# Patient Record
Sex: Male | Born: 1935 | Race: White | Hispanic: No | Marital: Married | Smoking: Never smoker
Health system: Southern US, Community
[De-identification: ages and names within clinical notes are randomized; demographics above are authoritative.]

## PROBLEM LIST (undated history)

## (undated) DIAGNOSIS — F0390 Unspecified dementia without behavioral disturbance: Secondary | ICD-10-CM

## (undated) HISTORY — PX: CHOLECYSTECTOMY: SHX55

---

## 2018-12-20 ENCOUNTER — Inpatient Hospital Stay
Admission: EM | Admit: 2018-12-20 | Discharge: 2019-01-01 | DRG: 871 | Disposition: E | Payer: Medicare Other | Attending: Internal Medicine | Admitting: Internal Medicine

## 2018-12-20 ENCOUNTER — Emergency Department: Payer: Medicare Other

## 2018-12-20 ENCOUNTER — Encounter: Payer: Self-pay | Admitting: Emergency Medicine

## 2018-12-20 ENCOUNTER — Other Ambulatory Visit: Payer: Self-pay

## 2018-12-20 DIAGNOSIS — D696 Thrombocytopenia, unspecified: Secondary | ICD-10-CM | POA: Diagnosis present

## 2018-12-20 DIAGNOSIS — G9341 Metabolic encephalopathy: Secondary | ICD-10-CM | POA: Diagnosis present

## 2018-12-20 DIAGNOSIS — Z20828 Contact with and (suspected) exposure to other viral communicable diseases: Secondary | ICD-10-CM | POA: Diagnosis present

## 2018-12-20 DIAGNOSIS — R64 Cachexia: Secondary | ICD-10-CM | POA: Diagnosis present

## 2018-12-20 DIAGNOSIS — X58XXXA Exposure to other specified factors, initial encounter: Secondary | ICD-10-CM | POA: Diagnosis present

## 2018-12-20 DIAGNOSIS — Z9049 Acquired absence of other specified parts of digestive tract: Secondary | ICD-10-CM

## 2018-12-20 DIAGNOSIS — Z79891 Long term (current) use of opiate analgesic: Secondary | ICD-10-CM

## 2018-12-20 DIAGNOSIS — I5021 Acute systolic (congestive) heart failure: Secondary | ICD-10-CM | POA: Diagnosis not present

## 2018-12-20 DIAGNOSIS — R339 Retention of urine, unspecified: Secondary | ICD-10-CM | POA: Diagnosis not present

## 2018-12-20 DIAGNOSIS — Z23 Encounter for immunization: Secondary | ICD-10-CM | POA: Diagnosis present

## 2018-12-20 DIAGNOSIS — I440 Atrioventricular block, first degree: Secondary | ICD-10-CM | POA: Diagnosis present

## 2018-12-20 DIAGNOSIS — I4891 Unspecified atrial fibrillation: Secondary | ICD-10-CM | POA: Diagnosis not present

## 2018-12-20 DIAGNOSIS — J9600 Acute respiratory failure, unspecified whether with hypoxia or hypercapnia: Secondary | ICD-10-CM

## 2018-12-20 DIAGNOSIS — I361 Nonrheumatic tricuspid (valve) insufficiency: Secondary | ICD-10-CM | POA: Diagnosis not present

## 2018-12-20 DIAGNOSIS — S50312A Abrasion of left elbow, initial encounter: Secondary | ICD-10-CM | POA: Diagnosis present

## 2018-12-20 DIAGNOSIS — G309 Alzheimer's disease, unspecified: Secondary | ICD-10-CM | POA: Diagnosis present

## 2018-12-20 DIAGNOSIS — J9601 Acute respiratory failure with hypoxia: Secondary | ICD-10-CM | POA: Diagnosis not present

## 2018-12-20 DIAGNOSIS — K8050 Calculus of bile duct without cholangitis or cholecystitis without obstruction: Secondary | ICD-10-CM | POA: Diagnosis not present

## 2018-12-20 DIAGNOSIS — R4182 Altered mental status, unspecified: Secondary | ICD-10-CM | POA: Diagnosis present

## 2018-12-20 DIAGNOSIS — R509 Fever, unspecified: Secondary | ICD-10-CM | POA: Diagnosis not present

## 2018-12-20 DIAGNOSIS — Z515 Encounter for palliative care: Secondary | ICD-10-CM | POA: Diagnosis not present

## 2018-12-20 DIAGNOSIS — J690 Pneumonitis due to inhalation of food and vomit: Secondary | ICD-10-CM | POA: Diagnosis not present

## 2018-12-20 DIAGNOSIS — F05 Delirium due to known physiological condition: Secondary | ICD-10-CM | POA: Diagnosis not present

## 2018-12-20 DIAGNOSIS — E43 Unspecified severe protein-calorie malnutrition: Secondary | ICD-10-CM | POA: Diagnosis present

## 2018-12-20 DIAGNOSIS — K8051 Calculus of bile duct without cholangitis or cholecystitis with obstruction: Secondary | ICD-10-CM | POA: Diagnosis present

## 2018-12-20 DIAGNOSIS — R001 Bradycardia, unspecified: Secondary | ICD-10-CM | POA: Diagnosis not present

## 2018-12-20 DIAGNOSIS — I4581 Long QT syndrome: Secondary | ICD-10-CM | POA: Diagnosis not present

## 2018-12-20 DIAGNOSIS — R05 Cough: Secondary | ICD-10-CM | POA: Diagnosis not present

## 2018-12-20 DIAGNOSIS — N1330 Unspecified hydronephrosis: Secondary | ICD-10-CM | POA: Diagnosis present

## 2018-12-20 DIAGNOSIS — R059 Cough, unspecified: Secondary | ICD-10-CM

## 2018-12-20 DIAGNOSIS — Z7189 Other specified counseling: Secondary | ICD-10-CM | POA: Diagnosis not present

## 2018-12-20 DIAGNOSIS — Z79899 Other long term (current) drug therapy: Secondary | ICD-10-CM

## 2018-12-20 DIAGNOSIS — Z66 Do not resuscitate: Secondary | ICD-10-CM | POA: Diagnosis present

## 2018-12-20 DIAGNOSIS — K8033 Calculus of bile duct with acute cholangitis with obstruction: Secondary | ICD-10-CM | POA: Diagnosis not present

## 2018-12-20 DIAGNOSIS — R6521 Severe sepsis with septic shock: Secondary | ICD-10-CM | POA: Diagnosis not present

## 2018-12-20 DIAGNOSIS — J45909 Unspecified asthma, uncomplicated: Secondary | ICD-10-CM | POA: Diagnosis present

## 2018-12-20 DIAGNOSIS — A419 Sepsis, unspecified organism: Secondary | ICD-10-CM | POA: Diagnosis present

## 2018-12-20 DIAGNOSIS — R410 Disorientation, unspecified: Secondary | ICD-10-CM

## 2018-12-20 DIAGNOSIS — F0280 Dementia in other diseases classified elsewhere without behavioral disturbance: Secondary | ICD-10-CM | POA: Diagnosis present

## 2018-12-20 DIAGNOSIS — J189 Pneumonia, unspecified organism: Secondary | ICD-10-CM | POA: Diagnosis present

## 2018-12-20 DIAGNOSIS — Z885 Allergy status to narcotic agent status: Secondary | ICD-10-CM

## 2018-12-20 DIAGNOSIS — I351 Nonrheumatic aortic (valve) insufficiency: Secondary | ICD-10-CM | POA: Diagnosis not present

## 2018-12-20 DIAGNOSIS — Z6820 Body mass index (BMI) 20.0-20.9, adult: Secondary | ICD-10-CM

## 2018-12-20 HISTORY — DX: Unspecified dementia, unspecified severity, without behavioral disturbance, psychotic disturbance, mood disturbance, and anxiety: F03.90

## 2018-12-20 LAB — CBC
HCT: 45 % (ref 39.0–52.0)
Hemoglobin: 14.7 g/dL (ref 13.0–17.0)
MCH: 29.8 pg (ref 26.0–34.0)
MCHC: 32.7 g/dL (ref 30.0–36.0)
MCV: 91.1 fL (ref 80.0–100.0)
Platelets: 90 10*3/uL — ABNORMAL LOW (ref 150–400)
RBC: 4.94 MIL/uL (ref 4.22–5.81)
RDW: 14.7 % (ref 11.5–15.5)
WBC: 5.7 10*3/uL (ref 4.0–10.5)
nRBC: 0 % (ref 0.0–0.2)

## 2018-12-20 LAB — LACTIC ACID, PLASMA
Lactic Acid, Venous: 2 mmol/L (ref 0.5–1.9)
Lactic Acid, Venous: 1.9 mmol/L (ref 0.5–1.9)

## 2018-12-20 LAB — PROTIME-INR
INR: 1 (ref 0.8–1.2)
INR: 1.2 (ref 0.8–1.2)
Prothrombin Time: 13.5 seconds (ref 11.4–15.2)
Prothrombin Time: 14.9 seconds (ref 11.4–15.2)

## 2018-12-20 LAB — TROPONIN I (HIGH SENSITIVITY)
Troponin I (High Sensitivity): 19 ng/L — ABNORMAL HIGH (ref <18)
Troponin I (High Sensitivity): 37 ng/L — ABNORMAL HIGH (ref <18)
Troponin I (High Sensitivity): 74 ng/L — ABNORMAL HIGH (ref <18)
Troponin I (High Sensitivity): 73 ng/L — ABNORMAL HIGH (ref <18)

## 2018-12-20 LAB — HEPATIC FUNCTION PANEL
ALT: 315 U/L — ABNORMAL HIGH (ref 0–44)
AST: 514 U/L — ABNORMAL HIGH (ref 15–41)
Albumin: 3.9 g/dL (ref 3.5–5.0)
Alkaline Phosphatase: 281 U/L — ABNORMAL HIGH (ref 38–126)
Bilirubin, Direct: 2.1 mg/dL — ABNORMAL HIGH (ref 0.0–0.2)
Indirect Bilirubin: 1.4 mg/dL — ABNORMAL HIGH (ref 0.3–0.9)
Total Bilirubin: 3.5 mg/dL — ABNORMAL HIGH (ref 0.3–1.2)
Total Protein: 6.5 g/dL (ref 6.5–8.1)

## 2018-12-20 LAB — BASIC METABOLIC PANEL
Anion gap: 8 (ref 5–15)
BUN: 21 mg/dL (ref 8–23)
CO2: 29 mmol/L (ref 22–32)
Calcium: 9.1 mg/dL (ref 8.9–10.3)
Chloride: 105 mmol/L (ref 98–111)
Creatinine, Ser: 1.17 mg/dL (ref 0.61–1.24)
GFR calc Af Amer: >60 mL/min (ref >60)
GFR calc non Af Amer: 57 mL/min — ABNORMAL LOW (ref >60)
Glucose, Bld: 88 mg/dL (ref 70–99)
Potassium: 3.6 mmol/L (ref 3.5–5.1)
Sodium: 142 mmol/L (ref 135–145)

## 2018-12-20 LAB — URINALYSIS, ROUTINE W REFLEX MICROSCOPIC
Bilirubin Urine: NEGATIVE
Glucose, UA: NEGATIVE mg/dL
Hgb urine dipstick: NEGATIVE
Ketones, ur: NEGATIVE mg/dL
Leukocytes,Ua: NEGATIVE
Nitrite: NEGATIVE
Protein, ur: NEGATIVE mg/dL
Specific Gravity, Urine: 1.009 (ref 1.005–1.030)
pH: 8 (ref 5.0–8.0)

## 2018-12-20 LAB — CREATININE, SERUM
Creatinine, Ser: 0.95 mg/dL (ref 0.61–1.24)
GFR calc Af Amer: >60 mL/min (ref >60)
GFR calc non Af Amer: >60 mL/min (ref >60)

## 2018-12-20 LAB — APTT: aPTT: 25 seconds (ref 24–36)

## 2018-12-20 LAB — SARS CORONAVIRUS 2 BY RT PCR (HOSPITAL ORDER, PERFORMED IN ~~LOC~~ HOSPITAL LAB): SARS Coronavirus 2: NEGATIVE

## 2018-12-20 MED ORDER — SODIUM CHLORIDE 0.9 % IV SOLN
INTRAVENOUS | Status: DC
  Administered 2018-12-20 – 2018-12-21 (×3): via INTRAVENOUS

## 2018-12-20 MED ORDER — SENNOSIDES-DOCUSATE SODIUM 8.6-50 MG PO TABS: 1.0000 | ORAL_TABLET | Freq: Every evening | ORAL | Status: DC | PRN

## 2018-12-20 MED ORDER — SODIUM CHLORIDE 0.9 % IV SOLN
2.0000 g | Freq: Once | INTRAVENOUS | Status: AC
  Administered 2018-12-20: 09:00:00 2 g via INTRAVENOUS
  Filled 2018-12-20: qty 2

## 2018-12-20 MED ORDER — MONTELUKAST SODIUM 10 MG PO TABS
10.0000 mg | ORAL_TABLET | Freq: Every day | ORAL | Status: DC
  Administered 2018-12-21 – 2018-12-22 (×2): 10 mg via ORAL
  Filled 2018-12-20 (×3): qty 1

## 2018-12-20 MED ORDER — FINASTERIDE 5 MG PO TABS
5.0000 mg | ORAL_TABLET | Freq: Every day | ORAL | Status: DC
  Administered 2018-12-21 – 2018-12-25 (×4): 5 mg via ORAL
  Filled 2018-12-20 (×5): qty 1

## 2018-12-20 MED ORDER — SODIUM CHLORIDE 0.9 % IV SOLN
2.0000 g | INTRAVENOUS | Status: DC
  Administered 2018-12-20: 2 g via INTRAVENOUS
  Filled 2018-12-20: qty 20
  Filled 2018-12-20: qty 2

## 2018-12-20 MED ORDER — METRONIDAZOLE IN NACL 5-0.79 MG/ML-% IV SOLN
500.0000 mg | Freq: Once | INTRAVENOUS | Status: AC
  Administered 2018-12-20: 09:00:00 500 mg via INTRAVENOUS
  Filled 2018-12-20: qty 100

## 2018-12-20 MED ORDER — ACETAMINOPHEN 325 MG PO TABS
650.0000 mg | ORAL_TABLET | Freq: Four times a day (QID) | ORAL | Status: DC | PRN
  Administered 2018-12-20: 650 mg via ORAL
  Filled 2018-12-20: qty 2

## 2018-12-20 MED ORDER — OXYCODONE HCL 5 MG PO TABS
5.0000 mg | ORAL_TABLET | Freq: Two times a day (BID) | ORAL | Status: DC | PRN
  Administered 2018-12-20 – 2018-12-21 (×2): 5 mg via ORAL
  Filled 2018-12-20 (×2): qty 1

## 2018-12-20 MED ORDER — HEPARIN SODIUM (PORCINE) 5000 UNIT/ML IJ SOLN
5000.0000 [IU] | Freq: Three times a day (TID) | INTRAMUSCULAR | Status: DC
  Administered 2018-12-20 – 2018-12-22 (×6): 5000 [IU] via SUBCUTANEOUS
  Filled 2018-12-20 (×6): qty 1

## 2018-12-20 MED ORDER — ACETAMINOPHEN 650 MG RE SUPP: 650.0000 mg | Freq: Four times a day (QID) | RECTAL | Status: DC | PRN

## 2018-12-20 MED ORDER — BISACODYL 5 MG PO TBEC: 5.0000 mg | DELAYED_RELEASE_TABLET | Freq: Every day | ORAL | Status: DC | PRN

## 2018-12-20 MED ORDER — ASPIRIN 325 MG PO TABS
325.0000 mg | ORAL_TABLET | Freq: Every day | ORAL | Status: DC
  Administered 2018-12-20 – 2018-12-21 (×2): 325 mg via ORAL
  Filled 2018-12-20 (×2): qty 1

## 2018-12-20 MED ORDER — SODIUM CHLORIDE 0.9 % IV BOLUS
1000.0000 mL | Freq: Once | INTRAVENOUS | Status: AC
  Administered 2018-12-20: 1000 mL via INTRAVENOUS

## 2018-12-20 MED ORDER — KETOROLAC TROMETHAMINE 15 MG/ML IJ SOLN
15.0000 mg | Freq: Four times a day (QID) | INTRAMUSCULAR | Status: DC | PRN
  Administered 2018-12-20 – 2018-12-21 (×2): 15 mg via INTRAVENOUS
  Filled 2018-12-20 (×2): qty 1

## 2018-12-20 MED ORDER — ALBUTEROL SULFATE (2.5 MG/3ML) 0.083% IN NEBU
2.5000 mg | INHALATION_SOLUTION | RESPIRATORY_TRACT | Status: DC | PRN
  Administered 2018-12-22: 15:00:00 2.5 mg via RESPIRATORY_TRACT
  Filled 2018-12-20: qty 3

## 2018-12-20 MED ORDER — SODIUM CHLORIDE 0.9 % IV SOLN
500.0000 mg | INTRAVENOUS | Status: DC
  Administered 2018-12-20: 500 mg via INTRAVENOUS
  Filled 2018-12-20 (×2): qty 500

## 2018-12-20 MED ORDER — ONDANSETRON HCL 4 MG PO TABS: 4.0000 mg | ORAL_TABLET | Freq: Four times a day (QID) | ORAL | Status: DC | PRN

## 2018-12-20 MED ORDER — ONDANSETRON HCL 4 MG/2ML IJ SOLN
4.0000 mg | Freq: Four times a day (QID) | INTRAMUSCULAR | Status: DC | PRN
  Administered 2018-12-24: 10:00:00 4 mg via INTRAVENOUS
  Filled 2018-12-20: qty 2

## 2018-12-20 MED ORDER — INFLUENZA VAC A&B SA ADJ QUAD 0.5 ML IM PRSY
0.5000 mL | PREFILLED_SYRINGE | INTRAMUSCULAR | Status: AC
  Administered 2018-12-22: 09:00:00 0.5 mL via INTRAMUSCULAR
  Filled 2018-12-20: qty 0.5

## 2018-12-20 MED ORDER — VANCOMYCIN HCL IN DEXTROSE 1-5 GM/200ML-% IV SOLN
1000.0000 mg | Freq: Once | INTRAVENOUS | Status: AC
  Administered 2018-12-20: 10:00:00 1000 mg via INTRAVENOUS
  Filled 2018-12-20: qty 200

## 2018-12-20 NOTE — Progress Notes (Signed)
PHARMACY -  BRIEF ANTIBIOTIC NOTE   Pharmacy has received consult(s) for Vancomycin and Cefepime from an ED provider.  The patient's profile has been reviewed for ht/wt/allergies/indication/available labs.    One time order(s) placed for Vancomycin, Cefepime, Metronidazole by ED provider.  Further antibiotics/pharmacy consults should be ordered by admitting physician if indicated.                       Thank you, Vira Blanco 12/18/2018  9:08 AM

## 2018-12-20 NOTE — ED Notes (Signed)
[facility] down at this time. Called Lab and let them know that a white label will be sent.

## 2018-12-20 NOTE — H&P (Addendum)
[place] [facility] [place] NAME: Zachary Holland    MR#:  [id number]  DATE OF BIRTH:  [birth year]  DATE OF ADMISSION:  2018/12/23  PRIMARY CARE PHYSICIAN: Administration, Veterans   REQUESTING/REFERRING PHYSICIAN: Harvest Dark, MD  CHIEF COMPLAINT:   Chief Complaint  Patient presents with  . Altered Mental Status   Confusion. HISTORY OF PRESENT ILLNESS:  Zachary Holland  is a 85 y.o. male with a known history of dementia and prostate problem.  The patient is confused and unresponsive to verbal stimuli.  According to his wife, they moved from [place] with tenia to this area recently.  The patient has a history of dementia and he got gallbladder surgery this February.  He started to have confusion since yesterday.  He is febrile at 102.2 with heart rate of 105.  Chest x-ray report right upper pneumonia.  ED physician started sepsis protocol and request admission. PAST MEDICAL HISTORY:  No past medical history on file.  Dementia and prostate problem.  PAST SURGICAL HISTORY:  Cholecystectomy this February.  SOCIAL HISTORY:   Social History   Tobacco Use  . Smoking status: Not on file  Substance Use Topics  . Alcohol use: Not on file    FAMILY HISTORY:  No family history on file.  Unable to obtain.  DRUG ALLERGIES:   Allergies  Allergen Reactions  . Tramadol Other (See Comments)    Altered mental status     REVIEW OF SYSTEMS:   Review of Systems  Unable to perform ROS: Mental status change    MEDICATIONS AT HOME:   Prior to Admission medications   Medication Sig Start Date End Date Taking? Authorizing Provider  cholecalciferol (VITAMIN D3) 25 MCG (1000 UT) tablet Take 1,000 Units by mouth daily.   Yes [provider]  finasteride (PROSCAR) 5 MG tablet Take 5 mg by mouth daily.   Yes [provider]  montelukast (SINGULAIR) 10 MG tablet Take 10 mg by mouth daily.   Yes [provider]  oxyCODONE (OXY  IR/ROXICODONE) 5 MG immediate release tablet Take 5 mg by mouth 2 (two) times daily as needed for moderate pain or severe pain.   Yes [provider]  pregabalin (LYRICA) 100 MG capsule Take 100 mg by mouth 2 (two) times daily.   Yes [provider]  vitamin B-12 (CYANOCOBALAMIN) 1000 MCG tablet Take 1,000 mcg by mouth daily.   Yes [provider]      VITAL SIGNS:  Blood pressure 101/63, pulse 97, temperature (!) 102.2 F (39 C), temperature source Rectal, resp. rate (!) 22, height 5\' 10"  (1.778 m), weight 63.5 kg, SpO2 93 %.  PHYSICAL EXAMINATION:  Physical Exam  GENERAL:  85 y.o.-year-old patient lying in the bed with no acute distress.  EYES: Pupils equal, round, reactive to light and accommodation. No scleral icterus. Extraocular muscles intact.  HEENT: Head atraumatic, normocephalic. NECK:  Supple, no jugular venous distention. No thyroid enlargement, no tenderness.  LUNGS: Normal breath sounds bilaterally, no wheezing, rales,rhonchi or crepitation. No use of accessory muscles of respiration.  CARDIOVASCULAR: S1, S2 normal. No murmurs, rubs, or gallops.  ABDOMEN: Soft, nontender, nondistended. Bowel sounds present. No organomegaly or mass.  EXTREMITIES: No pedal edema, cyanosis, or clubbing.  NEUROLOGIC: Unable to exam. PSYCHIATRIC: The patient is confused, not responsive to verbal stimuli. SKIN: No obvious rash, lesion, or ulcer.   LABORATORY PANEL:   CBC Recent Labs  Lab 2018-12-23 0832  WBC 5.7  HGB 14.7  HCT 45.0  PLT 90*   ------------------------------------------------------------------------------------------------------------------  Chemistries  Recent Labs  Lab January 10, 2019 0832  NA 142  K 3.6  CL 105  CO2 29  GLUCOSE 88  BUN 21  CREATININE 1.17  CALCIUM 9.1  AST 514*  ALT 315*  ALKPHOS 281*  BILITOT 3.5*   ------------------------------------------------------------------------------------------------------------------   Cardiac Enzymes No results for input(s): TROPONINI in the last 168 hours. ------------------------------------------------------------------------------------------------------------------  RADIOLOGY:  Dg Chest Port 1 View  Result Date: 10/14/2018 CLINICAL DATA:  Pt came via EMS from home for altered mental status. Per wife, pt does have hx of dementia but is able to talk and not normally confused at baseline. Per wife, pt woke up this am vomiting, moaning, and shivering. Pt told EMS, "hurts all over". Pt is shivering on arrival and not speaking when asked what his name and birthday wasa EXAM: PORTABLE CHEST 1 VIEW COMPARISON:  None. FINDINGS: Cardiac silhouette is normal in size. No mediastinal hilar masses. No evidence of adenopathy. Relatively small area of hazy airspace type opacities in the right upper lobe. Prominent bronchovascular markings in the medial lung bases. Lungs otherwise clear. No pleural effusion or pneumothorax. There left rib fractures.  No acute skeletal abnormality. IMPRESSION: 1. Small area of hazy opacity in the right upper lobe consistent pneumonia. Electronically Signed   By: Amie Portlandavid  Ormond M.D.   On: October 10, 202020 09:31      IMPRESSION AND PLAN:   Sepsis, possible due to CAP. The patient will be admitted to medical floor. Continue IV antibiotics, IV fluid support, follow-up CBC and cultures.  Abnormal liver function test.  Follow-up abdominal ultrasound and liver function test. MRCP per Dr. Allegra LaiVanga.  Lactic acidosis, improved after treatment lipase antibiotics and IV fluids.  Elevated troponin, possible due to sepsis.  Follow-up troponin. Start ASA. Cardiology consult from Dr. Lady GaryFath (informed).  Acute metabolic encephalopathy due to above. Treatment as above, aspiration and fall precaution.  Thrombocytopenia.  Unclear etiology.  Follow-up CBC.  Dementia.  Aspiration fall precaution.   All the records are reviewed and case discussed with ED provider. Management  plans discussed with the patient's wife and they are in agreement.  CODE STATUS: DNR.  TOTAL TIME TAKING CARE OF THIS PATIENT: 48 minutes.    Shaune PollackQing Taneika Choi M.D on 10/14/2018 at 11:57 AM  Between 7am to 6pm - Pager - [phone]  After 6pm go to [email or web address] - Scientist, research (life sciences)password EPAS [facility]  [facility] [place]  Office  [phone]  CC: Primary care physician; Administration, Veterans   Note: This dictation was prepared with Nurse, children'sDragon dictation along with smaller Lobbyistphrase technology. Any transcriptional errors that result from this process are unin

## 2018-12-20 NOTE — ED Notes (Addendum)
Talked to pt's wife about what happened this morning, basically the same as triage note. Pt LKW is 2200. Pt has a PMH of dementia and PSH of gallbladder removal. Wife states that pt is usually A&Ox4 but has "episodes" of forgetfulness but she never seen him like this before and he was fine last night before he went to bed. Pt's wife is on the way here now.   Diane Polivka [phone]

## 2018-12-20 NOTE — ED Provider Notes (Signed)
[name]  Time seen: 8:49 AM  I have reviewed the triage vital signs and the nursing notes.   HISTORY  Chief Complaint Altered Mental Status   HPI Zachary Holland is a 85 y.o. male with a past medical history of dementia who presents to the emergency department for altered mental status.  According to EMS report patient lives at home with his wife.  Has a history of mild dementia however is normally oriented and able to converse.  Wife noted today that the patient appeared confused, upon arrival to the emergency department patient found to be febrile to 102.2 with a heart rate of 105.  Patient is awake, will look at you when you speak to him but is not answering questions or following commands.  Will occasionally mumble something inaudible.   No past medical history on file.  There are no active problems to display for this patient.   Prior to Admission medications   Not on File    Not on File  No family history on file.  Social History Social History   Tobacco Use  . Smoking status: Not on file  Substance Use Topics  . Alcohol use: Not on file  . Drug use: Not on file    Review of Systems Unable to obtain an adequate/accurate review of systems secondary to altered mental status. ____________________________________________   PHYSICAL EXAM:  VITAL SIGNS: ED Triage Vitals  Enc Vitals Group     BP 12/28/2018 0836 117/69     Pulse Rate 12/22/2018 0836 (!) 105     Resp 12/15/2018 0836 18     Temp 12/06/2018 0836 (!) 102.2 F (39 C)     Temp Source 12/15/2018 0836 Rectal     SpO2 12/12/2018 0825 99 %     Weight --      Height --      Head Circumference --      Peak Flow --      Pain Score --      Pain Loc --      Pain Edu? --      Excl. in GC? --    Constitutional: Patient is awake, alert will look at you when speaking to him but will not answer questions or follow commands. Eyes: Normal exam ENT      Head:  Normocephalic and atraumatic.      Mouth/Throat: Mucous membranes are moist. Cardiovascular: Regular rhythm rate around 100 bpm. Respiratory: Normal respiratory effort without tachypnea nor retractions. Breath sounds are clear, no obvious wheeze rales or rhonchi. Gastrointestinal: Soft and nontender. No distention.  No reaction to abdominal palpation. Musculoskeletal: Nontender with normal range of motion in all extremities. Neurologic: Speech is garbled/unintelligible.  Appears to move all extremities at times. Skin:  Skin is warm, dry Psychiatric: Patient appears confused, altered.  Unable to assess psychiatrically currently.  ____________________________________________    EKG  EKG viewed and interpreted by myself shows sinus tachycardia 105 bpm with a narrow QRS, normal axis, normal intervals besides slight PR prolongation.  Nonspecific ST changes without ST elevation.  ____________________________________________    RADIOLOGY  X-ray consistent right upper lobe pneumonia. ____________________________________________   INITIAL IMPRESSION / ASSESSMENT AND PLAN / ED COURSE  Pertinent labs & imaging results that were available during my care of the patient were reviewed by me and considered in my medical decision making (see chart for details).   Patient presents to the emergency department with altered mental status found  to be febrile and tachycardic meeting sepsis criteria.  Differential includes infectious etiologies such as pneumonia, urinary tract infection, COVID-19.  Reassuringly patient has a benign abdominal exam without any reaction to abdominal palpation.  We will check labs, cultures, start on empiric antibiotics.  Will obtain chest x-ray imaging and continue to closely monitor.  Chest x-ray consistent with right upper lobe pneumonia.  Patient's LFTs are resulted severely elevated as well.  Again no tenderness to palpation however given his altered mental status his  abdominal exam is limited.  We will proceed with a right upper quadrant ultrasound to ensure no gallbladder disease.  Patient receiving broad-spectrum antibiotics.  We will admit to the medical service regardless given his sepsis and altered mental status.  If ultrasound is abnormal I will consider consulting surgery as well.  [name] was evaluated in Emergency Department on 12/30/2018 for the symptoms described in the history of present illness. He was evaluated in the context of the global COVID-19 pandemic, which necessitated consideration that the patient might be at risk for infection with the SARS-CoV-2 virus that causes COVID-19. Institutional protocols and algorithms that pertain to the evaluation of patients at risk for COVID-19 are in a state of rapid change based on information released by regulatory bodies including the CDC and federal and state organizations. These policies and algorithms were followed during the patient's care in the ED.  CRITICAL CARE Performed by: Harvest Dark   Total critical care time: 30 minutes  Critical care time was exclusive of separately billable procedures and treating other patients.  Critical care was necessary to treat or prevent imminent or life-threatening deterioration.  Critical care was time spent personally by me on the following activities: development of treatment plan with patient and/or surrogate as well as nursing, discussions with consultants, evaluation of patient's response to treatment, examination of patient, obtaining history from patient or surrogate, ordering and performing treatments and interventions, ordering and review of laboratory studies, ordering and review of radiographic studies, pulse oximetry and re-evaluation of patient's condition.  ____________________________________________   FINAL CLINICAL IMPRESSION(S) / ED DIAGNOSES  Altered mental status Sepsis   Harvest Dark, MD 12/25/2018 1442

## 2018-12-20 NOTE — Progress Notes (Signed)
CODE SEPSIS - PHARMACY COMMUNICATION  **Broad Spectrum Antibiotics should be administered within 1 hour of Sepsis diagnosis**  Time Code Sepsis Called/Page Received: 08:48  Antibiotics Ordered: Cefepime, Vancomycin, Metronidazole  Time of 1st antibiotic administration: Cefepime given at 09:04  Additional action taken by pharmacy: n/a  If necessary, Name of Provider/Nurse Contacted: n/a    Vira Blanco ,PharmD Clinical Pharmacist  12/15/2018  9:07 AM

## 2018-12-20 NOTE — ED Triage Notes (Signed)
Pt came via EMS from home for altered mental status. Per wife, pt does have hx of dementia but is able to talk and not normally confused at baseline. Per wife, pt woke up this am vomiting, moaning, and shivering. Pt told EMS, "hurts all over". Pt is shivering on arrival and not speaking when asked what his name and birthday was.

## 2018-12-20 NOTE — Progress Notes (Signed)
CRITICAL VALUE ALERT  Critical Value:  Troponin 74  Date & Time Notied:  12/06/2018 1458  Provider Notified: Dr. Bridgett Larsson  Orders Received/Actions taken: no new orders

## 2018-12-20 NOTE — ED Notes (Signed)
Date and time results received: 12/16/2018 0917  Test: Lactic Acid Critical Value: 2.0  Name of Provider Notified: Dr. Kerman Passey

## 2018-12-21 ENCOUNTER — Inpatient Hospital Stay: Payer: Medicare Other

## 2018-12-21 DIAGNOSIS — R4182 Altered mental status, unspecified: Secondary | ICD-10-CM

## 2018-12-21 DIAGNOSIS — K8033 Calculus of bile duct with acute cholangitis with obstruction: Secondary | ICD-10-CM

## 2018-12-21 LAB — CBC
HCT: 39.7 % (ref 39.0–52.0)
Hemoglobin: 12.8 g/dL — ABNORMAL LOW (ref 13.0–17.0)
MCH: 29.4 pg (ref 26.0–34.0)
MCHC: 32.2 g/dL (ref 30.0–36.0)
MCV: 91.1 fL (ref 80.0–100.0)
Platelets: 84 10*3/uL — ABNORMAL LOW (ref 150–400)
RBC: 4.36 MIL/uL (ref 4.22–5.81)
RDW: 14.6 % (ref 11.5–15.5)
WBC: 8.5 10*3/uL (ref 4.0–10.5)
nRBC: 0 % (ref 0.0–0.2)

## 2018-12-21 LAB — BASIC METABOLIC PANEL
Anion gap: 9 (ref 5–15)
BUN: 28 mg/dL — ABNORMAL HIGH (ref 8–23)
CO2: 21 mmol/L — ABNORMAL LOW (ref 22–32)
Calcium: 8.1 mg/dL — ABNORMAL LOW (ref 8.9–10.3)
Chloride: 112 mmol/L — ABNORMAL HIGH (ref 98–111)
Creatinine, Ser: 1.15 mg/dL (ref 0.61–1.24)
GFR calc Af Amer: >60 mL/min (ref >60)
GFR calc non Af Amer: 58 mL/min — ABNORMAL LOW (ref >60)
Glucose, Bld: 70 mg/dL (ref 70–99)
Potassium: 4.2 mmol/L (ref 3.5–5.1)
Sodium: 142 mmol/L (ref 135–145)

## 2018-12-21 LAB — URINE CULTURE: Culture: NO GROWTH

## 2018-12-21 LAB — LIPASE, BLOOD: Lipase: 17 U/L (ref 11–51)

## 2018-12-21 LAB — HEPATIC FUNCTION PANEL
ALT: 173 U/L — ABNORMAL HIGH (ref 0–44)
AST: 163 U/L — ABNORMAL HIGH (ref 15–41)
Albumin: 3.1 g/dL — ABNORMAL LOW (ref 3.5–5.0)
Alkaline Phosphatase: 205 U/L — ABNORMAL HIGH (ref 38–126)
Bilirubin, Direct: 5.1 mg/dL — ABNORMAL HIGH (ref 0.0–0.2)
Indirect Bilirubin: 2.3 mg/dL — ABNORMAL HIGH (ref 0.3–0.9)
Total Bilirubin: 7.4 mg/dL — ABNORMAL HIGH (ref 0.3–1.2)
Total Protein: 5.5 g/dL — ABNORMAL LOW (ref 6.5–8.1)

## 2018-12-21 LAB — GLUCOSE, CAPILLARY
Glucose-Capillary: 102 mg/dL — ABNORMAL HIGH (ref 70–99)
Glucose-Capillary: 56 mg/dL — ABNORMAL LOW (ref 70–99)

## 2018-12-21 MED ORDER — OXYCODONE HCL 5 MG PO TABS
5.0000 mg | ORAL_TABLET | ORAL | Status: DC | PRN
  Administered 2018-12-21: 5 mg via ORAL
  Filled 2018-12-21: qty 1

## 2018-12-21 MED ORDER — HALOPERIDOL LACTATE 5 MG/ML IJ SOLN
5.0000 mg | Freq: Once | INTRAMUSCULAR | Status: AC
  Administered 2018-12-21: 21:00:00 5 mg via INTRAVENOUS
  Filled 2018-12-21: qty 1

## 2018-12-21 MED ORDER — DEXTROSE-NACL 5-0.9 % IV SOLN
INTRAVENOUS | Status: DC
  Administered 2018-12-21 – 2018-12-22 (×2): via INTRAVENOUS

## 2018-12-21 MED ORDER — IOHEXOL 9 MG/ML PO SOLN
500.0000 mL | ORAL | Status: DC
  Administered 2018-12-21: 10:00:00 500 mL via ORAL

## 2018-12-21 MED ORDER — QUETIAPINE FUMARATE 25 MG PO TABS
25.0000 mg | ORAL_TABLET | Freq: Once | ORAL | Status: AC
  Administered 2018-12-21: 25 mg via ORAL
  Filled 2018-12-21: qty 1

## 2018-12-21 MED ORDER — LACTATED RINGERS IV BOLUS
500.0000 mL | Freq: Once | INTRAVENOUS | Status: AC
  Administered 2018-12-21: 12:00:00 500 mL via INTRAVENOUS

## 2018-12-21 MED ORDER — DEXTROSE 50 % IV SOLN
25.0000 mL | Freq: Once | INTRAVENOUS | Status: AC
  Administered 2018-12-21: 25 mL via INTRAVENOUS
  Filled 2018-12-21: qty 50

## 2018-12-21 MED ORDER — SODIUM CHLORIDE 0.9 % IV SOLN
3.0000 g | Freq: Four times a day (QID) | INTRAVENOUS | Status: DC
  Administered 2018-12-21 – 2018-12-22 (×6): 3 g via INTRAVENOUS
  Filled 2018-12-21: qty 8
  Filled 2018-12-21 (×3): qty 3
  Filled 2018-12-21: qty 8
  Filled 2018-12-21: qty 3
  Filled 2018-12-21: qty 8
  Filled 2018-12-21 (×2): qty 3
  Filled 2018-12-21: qty 8

## 2018-12-21 MED ORDER — LORAZEPAM 2 MG/ML IJ SOLN
1.0000 mg | Freq: Once | INTRAMUSCULAR | Status: AC
  Administered 2018-12-21: 1 mg via INTRAVENOUS
  Filled 2018-12-21: qty 1

## 2018-12-21 MED ORDER — DIPHENHYDRAMINE HCL 50 MG/ML IJ SOLN
50.0000 mg | Freq: Once | INTRAMUSCULAR | Status: AC
  Administered 2018-12-21: 50 mg via INTRAVENOUS
  Filled 2018-12-21: qty 1

## 2018-12-21 NOTE — Progress Notes (Signed)
[place] at [place] NAME: Zachary Holland    MR#:  [id number]  DATE OF BIRTH:  [birth year]  SUBJECTIVE:  patient has dementia. Poor historian. Came in with altered mental status and vomiting. Currently trying to drink contrast. Has abnormal liver enzymes.  REVIEW OF SYSTEMS:   Review of Systems  Unable to perform ROS: Dementia   Tolerating Diet:npo Tolerating PT:   DRUG ALLERGIES:   Allergies  Allergen Reactions  . Tramadol Other (See Comments)    Altered mental status     VITALS:  Blood pressure 114/69, pulse 61, temperature 97.8 F (36.6 C), temperature source Oral, resp. rate 19, height 5\' 10"  (1.778 m), weight 63.5 kg, SpO2 96 %.  PHYSICAL EXAMINATION:   Physical Exam  GENERAL:  85 y.o.-year-old patient lying in the bed with no acute distress.  EYES: Pupils equal, round, reactive to light and accommodation. No scleral icterus. Extraocular muscles intact.  HEENT: Head atraumatic, normocephalic. Oropharynx and nasopharynx clear.  NECK:  Supple, no jugular venous distention. No thyroid enlargement, no tenderness.  LUNGS: Normal breath sounds bilaterally, no wheezing, rales, rhonchi. No use of accessory muscles of respiration.  CARDIOVASCULAR: S1, S2 normal. No murmurs, rubs, or gallops.  ABDOMEN: Soft, nontender, nondistended. Bowel sounds present. No organomegaly or mass.  EXTREMITIES: No cyanosis, clubbing or edema b/l.    NEUROLOGIC: Cranial nerves II through XII are intact. No focal Motor or sensory deficits b/l.   PSYCHIATRIC:  patient is alert and oriented x 3.  SKIN: No obvious rash, lesion, or ulcer.   LABORATORY PANEL:  CBC Recent Labs  Lab 12/21/18 0445  WBC 8.5  HGB 12.8*  HCT 39.7  PLT 84*    Chemistries  Recent Labs  Lab 12/21/18 0445  NA 142  K 4.2  CL 112*  CO2 21*  GLUCOSE 70  BUN 28*  CREATININE 1.15  CALCIUM 8.1*  AST 163*  ALT 173*  ALKPHOS 205*  BILITOT 7.4*   Cardiac Enzymes No  results for input(s): TROPONINI in the last 168 hours. RADIOLOGY:  Mr Abdomen Wo Contrast  Result Date: 12/21/2018 CLINICAL DATA:  Abnormal LFTs, nausea/vomiting, confusion EXAM: MRI ABDOMEN WITHOUT CONTRAST TECHNIQUE: Multiplanar multisequence MR imaging was performed without the administration of intravenous contrast. COMPARISON:  Right upper quadrant ultrasound dated 12/10/2018 FINDINGS: Severely motion degraded study. Study was also aborted prematurely due to patient discomfort. Lower chest: Lung bases are clear. Hepatobiliary: Small hepatic cysts measuring up to 13 mm in the right hepatic lobe (series 4/image 15). Status post cholecystectomy. No intrahepatic ductal dilatation. Common duct measures 9 mm, within the upper limits of normal for postsurgical appearance. However, there is a possible 6 mm distal CBD stone at the ampulla (series 6/image 7), although poorly visualized/equivocal. Pancreas:  Grossly unremarkable. Spleen:  Within normal limits. Adrenals/Urinary Tract:  Adrenal glands are within normal limits. Kidneys are grossly unremarkable. Fullness of the right renal collecting system. Mild left hydronephrosis with prominent extrarenal pelvis (series 6/image 8). This appearance may be related to bladder distention. Stomach/Bowel: Stomach is within normal limits. Visualized bowel is grossly unremarkable. Vascular/Lymphatic:  No evidence of abdominal aortic aneurysm. No suspicious abdominal lymphadenopathy. Other:  Trace perihepatic ascites. Musculoskeletal: No focal osseous lesions. IMPRESSION: Markedly limited evaluation due to severe motion degradation. Study was also ordered prematurely due to patient discomfort. Status post cholecystectomy. Common duct measures 9 mm, within the upper limits of normal. However, there is a possible 6 mm distal CBD stone at  the ampulla, poorly visualized/equivocal. Consider ERCP as clinically warranted. Mild left hydronephrosis with prominent extrarenal pelvis.  Fullness of the right renal collecting system. This appearance may be related to bladder distension. Electronically Signed   By: Charline BillsSriyesh  Krishnan M.D.   On: 12/21/2018 10:22   Dg Chest Port 1 View  Result Date: 12/04/2018 CLINICAL DATA:  Pt came via EMS from home for altered mental status. Per wife, pt does have hx of dementia but is able to talk and not normally confused at baseline. Per wife, pt woke up this am vomiting, moaning, and shivering. Pt told EMS, "hurts all over". Pt is shivering on arrival and not speaking when asked what his name and birthday wasa EXAM: PORTABLE CHEST 1 VIEW COMPARISON:  None. FINDINGS: Cardiac silhouette is normal in size. No mediastinal hilar masses. No evidence of adenopathy. Relatively small area of hazy airspace type opacities in the right upper lobe. Prominent bronchovascular markings in the medial lung bases. Lungs otherwise clear. No pleural effusion or pneumothorax. There left rib fractures.  No acute skeletal abnormality. IMPRESSION: 1. Small area of hazy opacity in the right upper lobe consistent pneumonia. Electronically Signed   By: Amie Portlandavid  Ormond M.D.   On: 12/02/2018 09:31   [place] Abdomen Limited Ruq  Result Date: 12/04/2018 CLINICAL DATA:  Elevated liver function test. EXAM: ULTRASOUND ABDOMEN LIMITED RIGHT UPPER QUADRANT COMPARISON:  None. FINDINGS: Gallbladder: Surgically absent. Common bile duct: Diameter: 10 mm Liver: Increased parenchymal echogenicity. No liver mass or focal lesion. Portal vein is patent on color Doppler imaging with normal direction of blood flow towards the liver. Other: None. IMPRESSION: 1. No acute findings. 2. Increased liver parenchymal echogenicity consistent with hepatic steatosis. 3. Chronic common bile duct dilation.  Status post cholecystectomy. Electronically Signed   By: Amie Portlandavid  Ormond M.D.   On: 12/23/2018 11:59   ASSESSMENT AND PLAN:  Zachary DavidsonDavid Edward Holland is a 85 y.o. male with history of mild dementia, status post  cholecystectomy in 05/2018 who is admitted yesterday secondary to altered mental status compared to his baseline according to patient's wife.   1. Choledocholithiasis with possible ascending cholangitis -IV fluids, clear liquid diet, IV unison -G.I. consultation with Dr. Norva PavlovManga appreciated. -Elevated LFTs with direct bilirubin. -MRCP done today shows possible stone in the common bile duct--- will keep patient NPO after midnight. Dr. Servando SnareWohl plans on ERCP -cardiology clearance requested to Dr. Lady GaryFath  2. Mild elevated troponin in the setting of right upper quadrant pain -no cardiac history. -Dr. Lady GaryFath input awaited.  3.Dementia at baseline  Above was discussed with patient's wife she is agreeable with the plan.  Case discussed with Care Management/Social Worker. Management plans discussed with the patient, family and they are in agreement.  CODE STATUS: DNR  DVT Prophylaxis: heparin  TOTAL TIME TAKING CARE OF THIS PATIENT: *30* minutes.  >50% time spent on counselling and coordination of care  POSSIBLE D/C IN *1-2* DAYS, DEPENDING ON CLINICAL CONDITION.  Note: This dictation was prepared with Dragon dictation along with smaller phrase technology. Any transcriptional errors that result from this process are unintentional.  Enedina FinnerSona Arthi Mcdonald M.D on 12/21/2018 at 1:31 PM  Between 7am to 6pm - Pager - [phone]  After 6pm go to [email or web address] - Social research officer, governmentpassword EPAS [facility]  [facility] [place] [facility]  Office  [phone]  CC: Primary care physician; Administration, VeteransPatient ID: Zachary Davidsonavid Edward Holland, male   DOB: 05/05/1932, 85 y.o.   MRN: [id number]

## 2018-12-21 NOTE — Progress Notes (Signed)
Hypoglycemic Event  CBG: 56  Treatment: D50 25 mL (12.5 gm)  Symptoms: None  Follow-up CBG: Time:1605 CBG Result:102  Possible Reasons for Event: Inadequate meal intake  Comments/MD notified:yes    Zachary Holland

## 2018-12-21 NOTE — Consult Note (Signed)
Cardiology Consultation Note    Patient ID: Zachary Holland, MRN: [id number], DOB/AGE: 83-Oct-1934 83 y.o. Admit date: 12/25/2018   Date of Consult: 12/21/2018 Primary Physician: [facility] Primary Cardiologist:    Chief Complaint: confusion Reason for Consultation: elevated troponin Requesting MD: Dr. Fritzi Mandes  HPI: Zachary Holland is a 85 y.o. male with history of dementia and apparent reactive airway disease based on his medications who was admitted with apparent altered mental status.  Patient is not able to give history.  Consult was called for mildly elevated troponin 74.  Patient was also febrile with a temperature of 102.2 and a heart rate of 105.  Chest x-ray reported right upper lobe pneumonia.  It is of note that he had his gallbladder removed February.  EKG showed sinus rhythm with first-degree AV block.  Nonspecific ST-T wave changes.  Chest x-ray revealed acute findings.  Ultrasound revealed common bile duct dilation with increased liver parenchymal echogenicity consistent with hepatic steatosis.  Patient is being considered for MRCP tomorrow.  He has no chest pain.  He was evaluated at the [facility] in [place] in the recent past prior to his surgical procedure and did well.  He and his wife states that he has been stable from a cardiac standpoint.  Past Medical History:  Diagnosis Date  . Dementia [facility])       Surgical History:  Past Surgical History:  Procedure Laterality Date  . CHOLECYSTECTOMY       Home Meds: Prior to Admission medications   Medication Sig Start Date End Date Taking? Authorizing Provider  cholecalciferol (VITAMIN D3) 25 MCG (1000 UT) tablet Take 1,000 Units by mouth daily.   Yes [provider]  finasteride (PROSCAR) 5 MG tablet Take 5 mg by mouth daily.   Yes [provider]  montelukast (SINGULAIR) 10 MG tablet Take 10 mg by mouth daily.   Yes [provider]  oxyCODONE (OXY  IR/ROXICODONE) 5 MG immediate release tablet Take 5 mg by mouth 2 (two) times daily as needed for moderate pain or severe pain.   Yes [provider]  pregabalin (LYRICA) 100 MG capsule Take 100 mg by mouth 2 (two) times daily.   Yes [provider]  vitamin B-12 (CYANOCOBALAMIN) 1000 MCG tablet Take 1,000 mcg by mouth daily.   Yes [provider]    Inpatient Medications:  . aspirin  325 mg Oral Daily  . finasteride  5 mg Oral Daily  . heparin  5,000 Units Subcutaneous Q8H  . influenza vaccine adjuvanted  0.5 mL Intramuscular Tomorrow-1000  . montelukast  10 mg Oral Daily   . sodium chloride 100 mL/hr at 12/21/18 0220  . azithromycin 250 mL/hr at 12/31/2018 1607  . cefTRIAXone (ROCEPHIN)  IV Stopped (12/12/2018 1503)    Allergies:  Allergies  Allergen Reactions  . Tramadol Other (See Comments)    Altered mental status     Social History   Socioeconomic History  . Marital status: Married    Spouse name: Not on file  . Number of children: Not on file  . Years of education: Not on file  . Highest education level: Not on file  Occupational History  . Not on file  Social Needs  . Financial resource strain: Not on file  . Food insecurity    Worry: Not on file    Inability: Not on file  . Transportation needs    Medical: Not on file    Non-medical:  Not on file  Tobacco Use  . Smoking status: Never Smoker  . Smokeless tobacco: Never Used  Substance and Sexual Activity  . Alcohol use: Not on file  . Drug use: Not on file  . Sexual activity: Not on file  Lifestyle  . Physical activity    Days per week: Not on file    Minutes per session: Not on file  . Stress: Not on file  Relationships  . Social Musician on phone: Not on file    Gets together: Not on file    Attends religious service: Not on file    Active member of club or organization: Not on file    Attends meetings of clubs or organizations: Not on file    Relationship  status: Not on file  . Intimate partner violence    Fear of current or ex partner: Not on file    Emotionally abused: Not on file    Physically abused: Not on file    Forced sexual activity: Not on file  Other Topics Concern  . Not on file  Social History Narrative  . Not on file     History reviewed. No pertinent family history.   Review of Systems: A 12-system review of systems was performed and is negative except as noted in the HPI.  Labs: No results for input(s): CKTOTAL, CKMB, TROPONINI in the last 72 hours. Lab Results  Component Value Date   WBC 8.5 12/21/2018   HGB 12.8 (L) 12/21/2018   HCT 39.7 12/21/2018   MCV 91.1 12/21/2018   PLT 84 (L) 12/21/2018    Recent Labs  Lab 12/21/18 0445  NA 142  K 4.2  CL 112*  CO2 21*  BUN 28*  CREATININE 1.15  CALCIUM 8.1*  PROT 5.5*  BILITOT 7.4*  ALKPHOS 205*  ALT 173*  AST 163*  GLUCOSE 70   No results found for: CHOL, HDL, LDLCALC, TRIG No results found for: DDIMER  Radiology/Studies:  Dg Chest Port 1 View  Result Date: 01-04-19 CLINICAL DATA:  Pt came via EMS from home for altered mental status. Per wife, pt does have hx of dementia but is able to talk and not normally confused at baseline. Per wife, pt woke up this am vomiting, moaning, and shivering. Pt told EMS, "hurts all over". Pt is shivering on arrival and not speaking when asked what his name and birthday wasa EXAM: PORTABLE CHEST 1 VIEW COMPARISON:  None. FINDINGS: Cardiac silhouette is normal in size. No mediastinal hilar masses. No evidence of adenopathy. Relatively small area of hazy airspace type opacities in the right upper lobe. Prominent bronchovascular markings in the medial lung bases. Lungs otherwise clear. No pleural effusion or pneumothorax. There left rib fractures.  No acute skeletal abnormality. IMPRESSION: 1. Small area of hazy opacity in the right upper lobe consistent pneumonia. Electronically Signed   By: Amie Portland M.D.   On: 01-04-19  09:31   US Abdomen Limited Ruq  Result Date: 01-04-2019 CLINICAL DATA:  Elevated liver function test. EXAM: ULTRASOUND ABDOMEN LIMITED RIGHT UPPER QUADRANT COMPARISON:  None. FINDINGS: Gallbladder: Surgically absent. Common bile duct: Diameter: 10 mm Liver: Increased parenchymal echogenicity. No liver mass or focal lesion. Portal vein is patent on color Doppler imaging with normal direction of blood flow towards the liver. Other: None. IMPRESSION: 1. No acute findings. 2. Increased liver parenchymal echogenicity consistent with hepatic steatosis. 3. Chronic common bile duct dilation.  Status post cholecystectomy. Electronically Signed  By: Amie Portlandavid  Ormond M.D.   On: June 02, 2018 11:59    Wt Readings from Last 3 Encounters:  11-May-2018 63.5 kg    EKG: Sinus rhythm with no ischemia  Physical Exam:  Blood pressure (!) 109/51, pulse (!) 49, temperature 97.8 F (36.6 C), temperature source Oral, resp. rate 16, height 5\' 10"  (1.778 m), weight 63.5 kg, SpO2 100 %. Body mass index is 20.09 kg/m. General: Well developed, well nourished, in no acute distress. Head: Normocephalic, atraumatic, sclera non-icteric, no xanthomas, nares are without discharge.  Neck: Negative for carotid bruits. JVD not elevated. Lungs: Clear bilaterally to auscultation without wheezes, rales, or rhonchi. Breathing is unlabored. Heart: RRR with S1 S2. No murmurs, rubs, or gallops appreciated. Abdomen: Soft, non-tender, non-distended with normoactive bowel sounds. No hepatomegaly. No rebound/guarding. No obvious abdominal masses. Msk:  Strength and tone appear normal for age. Extremities: No clubbing or cyanosis. No edema.  Distal pedal pulses are 2+ and equal bilaterally. Neuro: Alert and oriented.  Answers appropriate to all questions.. No facial asymmetry. No focal deficit. Moves all extremities spontaneously. Psych:  Responds to questions appropriately with a normal affect.     Assessment and Plan  85 year old male with  history of reactive airway disease with a negative cardiac history who was admitted with tachycardia and febrile illness with right upper lobe pneumonia and probable cholecystitis.  MRI revealed occluded common bile duct.  Patient had a mildly elevated serum troponin.  He and his wife state he has been stable from a cardiac standpoint.  He is a somewhat difficult historian but denies any chest pain.  He has no prior cardiac abnormalities.  Would proceed with MRCP with no further cardiac work-up.  He appears to be a low risk from a cardiac standpoint.  Caren MacadamSigned, Chellsea Beckers A Sanyiah Kanzler MD 12/21/2018, 8:41 AM Pager: [phone]

## 2018-12-21 NOTE — Progress Notes (Signed)
RN notified MD pt BG was 56. RN will give half amp of D50. Per MD okay for RN to change IVF to D5 with NS going at 75 ML/hr. RN will continue to assess and monitor pt.

## 2018-12-21 NOTE — Progress Notes (Signed)
Per Dr. Marius Ditch okay for RN to order clear liquids diet and NPO after midnight.

## 2018-12-21 NOTE — Consult Note (Signed)
Cephas Darby, MD [address]  [place]  [place], [place] [address]  Main: [phone]  Fax: [phone] Pager: [phone]   Consultation  Referring Provider:     No ref. provider found Primary Care Physician:  [facility] Primary Gastroenterologist: Althia Forts         Reason for Consultation:     Jaundice  Date of Admission:  12/25/2018 Date of Consultation:  12/21/2018         HPI:   Zachary Holland is a 85 y.o. male with history of mild dementia, status post cholecystectomy in 05/2018 who is admitted yesterday secondary to altered mental status compared to his baseline according to patient's wife.  Patient is found to have tachycardia 105, temp 102.2, chest x-ray revealed right upper pneumonia.  Labs revealed normal WBC, thrombocytopenia, alkaline phosphatase 205, total bilirubin 3.5, AST 514, ALT 315, serum lactate 2.  Sepsis protocol was initiated.  He is started on Unasyn.  He was also found to have significant elevated troponin with no EKG changes and received aspirin 325 mg.  This morning, his LFTs worsened ALP 205, AST 163, ALT 173, T bili 7.4, direct bilirubin 5.1.  Ultrasound on admission revealed CBD 10 mm in size.  Subsequently patient underwent incomplete MRI/MRCP and was found to have 6 mm stone in the common bile duct.  When I interviewed the patient this morning, he denies abdominal pain, chest pain, shortness of breath he appeared jaundiced, he complained of pain in his bilateral hands. Patient is currently evaluated by cardiology for elevated troponin levels  NSAIDs: None  Antiplts/Anticoagulants/Anti thrombotics: None  GI Procedures: Unknown  Past Medical History:  Diagnosis Date   Dementia ([place])     Past Surgical History:  Procedure Laterality Date   CHOLECYSTECTOMY      Prior to Admission medications   Medication Sig Start Date End Date Taking? Authorizing Provider  cholecalciferol (VITAMIN D3) 25 MCG (1000 UT) tablet  Take 1,000 Units by mouth daily.   Yes [provider]  finasteride (PROSCAR) 5 MG tablet Take 5 mg by mouth daily.   Yes [provider]  montelukast (SINGULAIR) 10 MG tablet Take 10 mg by mouth daily.   Yes [provider]  oxyCODONE (OXY IR/ROXICODONE) 5 MG immediate release tablet Take 5 mg by mouth 2 (two) times daily as needed for moderate pain or severe pain.   Yes [provider]  pregabalin (LYRICA) 100 MG capsule Take 100 mg by mouth 2 (two) times daily.   Yes [provider]  vitamin B-12 (CYANOCOBALAMIN) 1000 MCG tablet Take 1,000 mcg by mouth daily.   Yes [provider]    Current Facility-Administered Medications:    0.9 %  sodium chloride infusion, , Intravenous, Continuous, Demetrios Loll, MD, Last Rate: 100 mL/hr at 12/21/18 0220   acetaminophen (TYLENOL) tablet 650 mg, 650 mg, Oral, Q6H PRN, 650 mg at 12/10/2018 1434 **OR** acetaminophen (TYLENOL) suppository 650 mg, 650 mg, Rectal, Q6H PRN, Demetrios Loll, MD   albuterol (PROVENTIL) (2.5 MG/3ML) 0.083% nebulizer solution 2.5 mg, 2.5 mg, Nebulization, Q2H PRN, Demetrios Loll, MD   Ampicillin-Sulbactam (UNASYN) 3 g in sodium chloride 0.9 % 100 mL IVPB, 3 g, Intravenous, Q6H, Fritzi Mandes, MD   bisacodyl (DULCOLAX) EC tablet 5 mg, 5 mg, Oral, Daily PRN, Demetrios Loll, MD   finasteride (PROSCAR) tablet 5 mg, 5 mg, Oral, Daily, Demetrios Loll, MD, 5 mg at 12/21/18 0906   heparin injection 5,000 Units, 5,000 Units,  Subcutaneous, Q8H, Demetrios Loll, MD, 5,000 Units at 12/21/18 0531   influenza vaccine adjuvanted (FLUAD) injection 0.5 mL, 0.5 mL, Intramuscular, Tomorrow-1000, Demetrios Loll, MD   lactated ringers bolus 500 mL, 500 mL, Intravenous, Once, Giles Currie, Tally Due, MD   montelukast (SINGULAIR) tablet 10 mg, 10 mg, Oral, Daily, Demetrios Loll, MD, 10 mg at 12/21/18 0906   ondansetron (ZOFRAN) tablet 4 mg, 4 mg, Oral, Q6H PRN **OR** ondansetron (ZOFRAN) injection 4 mg, 4 mg, Intravenous, Q6H PRN,  Demetrios Loll, MD   oxyCODONE (Oxy IR/ROXICODONE) immediate release tablet 5 mg, 5 mg, Oral, Q4H PRN, Fritzi Mandes, MD   senna-docusate (Senokot-S) tablet 1 tablet, 1 tablet, Oral, QHS PRN, Demetrios Loll, MD  History reviewed. No pertinent family history.   Social History   Tobacco Use   Smoking status: Never Smoker   Smokeless tobacco: Never Used  Substance Use Topics   Alcohol use: Not on file   Drug use: Not on file    Allergies as of 12/15/2018 - Review Complete 12/28/2018  Allergen Reaction Noted   Tramadol Other (See Comments) 12/06/2018    Review of Systems:    All systems reviewed and negative except where noted in HPI.   Physical Exam:  Vital signs in last 24 hours: Temp:  [97.8 F (36.6 C)-101.4 F (38.6 C)] 97.8 F (36.6 C) (09/20 0454) Pulse Rate:  [49-100] 49 (09/20 0454) Resp:  [14-22] 16 (09/20 0454) BP: (94-109)/(51-70) 109/51 (09/20 0454) SpO2:  [93 %-100 %] 100 % (09/20 0454) Last BM Date: 12/19/18   General:   Pleasant, cooperative in NAD Head:  Normocephalic and atraumatic. Eyes: Deep icterus.   Conjunctiva pink. PERRLA. Ears:  Normal auditory acuity. Neck:  Supple; no masses or thyroidomegaly Lungs: Respirations even and unlabored. Lungs clear to auscultation bilaterally.   No wheezes, crackles, or rhonchi.  Heart:  Regular rate and rhythm;  Without murmur, clicks, rubs or gallops Abdomen:  Soft, nondistended, nontender. Normal bowel sounds. No appreciable masses or hepatomegaly.  No rebound or guarding.  Rectal:  Not performed. Msk:  Symmetrical without gross deformities.  Strength generalized weakness Extremities:  Without edema, cyanosis or clubbing. Neurologic:  Alert and oriented x1; Skin:  Intact without significant lesions or rashes. Psych:  Alert and cooperative. Normal affect.  LAB RESULTS: CBC Latest Ref Rng & Units 12/21/2018 12/09/2018  WBC 4.0 - 10.5 K/uL 8.5 5.7  Hemoglobin 13.0 - 17.0 g/dL 12.8(L) 14.7  Hematocrit 39.0 - 52.0 %  39.7 45.0  Platelets 150 - 400 K/uL 84(L) 90(L)    BMET BMP Latest Ref Rng & Units 12/21/2018 12/05/2018 12/03/2018  Glucose 70 - 99 mg/dL 70 - 88  BUN 8 - 23 mg/dL 28(H) - 21  Creatinine 0.61 - 1.24 mg/dL 1.15 0.95 1.17  Sodium 135 - 145 mmol/L 142 - 142  Potassium 3.5 - 5.1 mmol/L 4.2 - 3.6  Chloride 98 - 111 mmol/L 112(H) - 105  CO2 22 - 32 mmol/L 21(L) - 29  Calcium 8.9 - 10.3 mg/dL 8.1(L) - 9.1    LFT Hepatic Function Latest Ref Rng & Units 12/21/2018 12/07/2018  Total Protein 6.5 - 8.1 g/dL 5.5(L) 6.5  Albumin 3.5 - 5.0 g/dL 3.1(L) 3.9  AST 15 - 41 U/L 163(H) 514(H)  ALT 0 - 44 U/L 173(H) 315(H)  Alk Phosphatase 38 - 126 U/L 205(H) 281(H)  Total Bilirubin 0.3 - 1.2 mg/dL 7.4(H) 3.5(H)  Bilirubin, Direct 0.0 - 0.2 mg/dL 5.1(H) 2.1(H)     STUDIES: Mr Abdomen Wo Contrast  Result Date: 12/21/2018 CLINICAL DATA:  Abnormal LFTs, nausea/vomiting, confusion EXAM: MRI ABDOMEN WITHOUT CONTRAST TECHNIQUE: Multiplanar multisequence MR imaging was performed without the administration of intravenous contrast. COMPARISON:  Right upper quadrant ultrasound dated 12/12/2018 FINDINGS: Severely motion degraded study. Study was also aborted prematurely due to patient discomfort. Lower chest: Lung bases are clear. Hepatobiliary: Small hepatic cysts measuring up to 13 mm in the right hepatic lobe (series 4/image 15). Status post cholecystectomy. No intrahepatic ductal dilatation. Common duct measures 9 mm, within the upper limits of normal for postsurgical appearance. However, there is a possible 6 mm distal CBD stone at the ampulla (series 6/image 7), although poorly visualized/equivocal. Pancreas:  Grossly unremarkable. Spleen:  Within normal limits. Adrenals/Urinary Tract:  Adrenal glands are within normal limits. Kidneys are grossly unremarkable. Fullness of the right renal collecting system. Mild left hydronephrosis with prominent extrarenal pelvis (series 6/image 8). This appearance may be related to  bladder distention. Stomach/Bowel: Stomach is within normal limits. Visualized bowel is grossly unremarkable. Vascular/Lymphatic:  No evidence of abdominal aortic aneurysm. No suspicious abdominal lymphadenopathy. Other:  Trace perihepatic ascites. Musculoskeletal: No focal osseous lesions. IMPRESSION: Markedly limited evaluation due to severe motion degradation. Study was also ordered prematurely due to patient discomfort. Status post cholecystectomy. Common duct measures 9 mm, within the upper limits of normal. However, there is a possible 6 mm distal CBD stone at the ampulla, poorly visualized/equivocal. Consider ERCP as clinically warranted. Mild left hydronephrosis with prominent extrarenal pelvis. Fullness of the right renal collecting system. This appearance may be related to bladder distension. Electronically Signed   By: Julian Hy M.D.   On: 12/21/2018 10:22   Dg Chest Port 1 View  Result Date: 12/06/2018 CLINICAL DATA:  Pt came via EMS from home for altered mental status. Per wife, pt does have hx of dementia but is able to talk and not normally confused at baseline. Per wife, pt woke up this am vomiting, moaning, and shivering. Pt told EMS, "hurts all over". Pt is shivering on arrival and not speaking when asked what his name and birthday wasa EXAM: PORTABLE CHEST 1 VIEW COMPARISON:  None. FINDINGS: Cardiac silhouette is normal in size. No mediastinal hilar masses. No evidence of adenopathy. Relatively small area of hazy airspace type opacities in the right upper lobe. Prominent bronchovascular markings in the medial lung bases. Lungs otherwise clear. No pleural effusion or pneumothorax. There left rib fractures.  No acute skeletal abnormality. IMPRESSION: 1. Small area of hazy opacity in the right upper lobe consistent pneumonia. Electronically Signed   By: Lajean Manes M.D.   On: 12/30/2018 09:31   US Abdomen Limited Ruq  Result Date: 12/17/2018 CLINICAL DATA:  Elevated liver function  test. EXAM: ULTRASOUND ABDOMEN LIMITED RIGHT UPPER QUADRANT COMPARISON:  None. FINDINGS: Gallbladder: Surgically absent. Common bile duct: Diameter: 10 mm Liver: Increased parenchymal echogenicity. No liver mass or focal lesion. Portal vein is patent on color Doppler imaging with normal direction of blood flow towards the liver. Other: None. IMPRESSION: 1. No acute findings. 2. Increased liver parenchymal echogenicity consistent with hepatic steatosis. 3. Chronic common bile duct dilation.  Status post cholecystectomy. Electronically Signed   By: Lajean Manes M.D.   On: 12/17/2018 11:59      Impression / Plan:   Zachary Holland is a 85 y.o. male with history of mild dementia, who is independent of ADLs at baseline, status post cholecystectomy in 05/2018 presented with fever, hypotension found to have obstructive jaundice secondary to choledocholithiasis, also  has elevated troponin.  Patient received aspirin 325 mg today at 9 AM.  Patient received azithromycin, cefepime, ceftriaxone, metronidazole today  Choledocholithiasis with probable ascending cholangitis Afebrile since admission, has been hemodynamically stable Worsening LFTs Check serum lipase Continue maintenance IV fluids, ordered 500 mL bolus of LR N.p.o. Continue Unasyn Follow-up on blood cultures ERCP by Dr. Allen Norris tomorrow once cleared by cardiology Low threshold to transfer to ICU if clinically deteriorates  Thank you for involving me in the care of this patient.  We will continue to follow along with you    LOS: 1 day   Sherri Sear, MD  12/21/2018, 11:29 AM   Note: This dictation was prepared with Dragon dictation along with smaller phrase technology. Any transcriptional errors that result from this process are unintentional.

## 2018-12-21 NOTE — Progress Notes (Signed)
Dr Jannifer Franklin notified of pt trying to get up, anxious, agitated, orders given.

## 2018-12-21 NOTE — Progress Notes (Signed)
MRI notified RN they were not able to finish doing his MRI because pt was moving too much. Pt is confuse and is very hard to follow commands. RN will notified MD.

## 2018-12-22 ENCOUNTER — Encounter: Admission: EM | Disposition: E | Payer: Self-pay | Source: Home / Self Care | Attending: Internal Medicine

## 2018-12-22 ENCOUNTER — Inpatient Hospital Stay: Payer: Medicare Other

## 2018-12-22 ENCOUNTER — Encounter: Payer: Self-pay | Admitting: Anesthesiology

## 2018-12-22 ENCOUNTER — Inpatient Hospital Stay: Payer: Medicare Other | Admitting: Certified Registered Nurse Anesthetist

## 2018-12-22 DIAGNOSIS — K8050 Calculus of bile duct without cholangitis or cholecystitis without obstruction: Secondary | ICD-10-CM

## 2018-12-22 HISTORY — PX: ERCP: SHX5425

## 2018-12-22 LAB — BLOOD GAS, ARTERIAL
Acid-base deficit: 0.9 mmol/L (ref 0.0–2.0)
Bicarbonate: 21.8 mmol/L (ref 20.0–28.0)
FIO2: 0.28
O2 Saturation: 94.2 %
Patient temperature: 37
pCO2 arterial: 30 mmHg — ABNORMAL LOW (ref 32.0–48.0)
pH, Arterial: 7.47 — ABNORMAL HIGH (ref 7.350–7.450)
pO2, Arterial: 67 mmHg — ABNORMAL LOW (ref 83.0–108.0)

## 2018-12-22 LAB — CBC
HCT: 41.7 % (ref 39.0–52.0)
Hemoglobin: 13.6 g/dL (ref 13.0–17.0)
MCH: 29.6 pg (ref 26.0–34.0)
MCHC: 32.6 g/dL (ref 30.0–36.0)
MCV: 90.7 fL (ref 80.0–100.0)
Platelets: 97 10*3/uL — ABNORMAL LOW (ref 150–400)
RBC: 4.6 MIL/uL (ref 4.22–5.81)
RDW: 15.1 % (ref 11.5–15.5)
WBC: 7.5 10*3/uL (ref 4.0–10.5)
nRBC: 0 % (ref 0.0–0.2)

## 2018-12-22 LAB — COMPREHENSIVE METABOLIC PANEL
ALT: 110 U/L — ABNORMAL HIGH (ref 0–44)
AST: 80 U/L — ABNORMAL HIGH (ref 15–41)
Albumin: 3.4 g/dL — ABNORMAL LOW (ref 3.5–5.0)
Alkaline Phosphatase: 244 U/L — ABNORMAL HIGH (ref 38–126)
Anion gap: 9 (ref 5–15)
BUN: 21 mg/dL (ref 8–23)
CO2: 24 mmol/L (ref 22–32)
Calcium: 8.1 mg/dL — ABNORMAL LOW (ref 8.9–10.3)
Chloride: 109 mmol/L (ref 98–111)
Creatinine, Ser: 1.05 mg/dL (ref 0.61–1.24)
GFR calc Af Amer: >60 mL/min (ref >60)
GFR calc non Af Amer: >60 mL/min (ref >60)
Glucose, Bld: 118 mg/dL — ABNORMAL HIGH (ref 70–99)
Potassium: 3.9 mmol/L (ref 3.5–5.1)
Sodium: 142 mmol/L (ref 135–145)
Total Bilirubin: 6.5 mg/dL — ABNORMAL HIGH (ref 0.3–1.2)
Total Protein: 6.1 g/dL — ABNORMAL LOW (ref 6.5–8.1)

## 2018-12-22 SURGERY — ERCP, WITH INTERVENTION IF INDICATED
Anesthesia: General

## 2018-12-22 MED ORDER — INDOMETHACIN 50 MG RE SUPP
100.0000 mg | Freq: Once | RECTAL | Status: AC
  Administered 2018-12-22: 12:00:00 100 mg via RECTAL

## 2018-12-22 MED ORDER — FENTANYL CITRATE (PF) 100 MCG/2ML IJ SOLN: 25.0000 ug | INTRAMUSCULAR | Status: DC | PRN

## 2018-12-22 MED ORDER — LORAZEPAM 2 MG/ML IJ SOLN
0.5000 mg | Freq: Once | INTRAMUSCULAR | Status: AC
  Administered 2018-12-22: 0.5 mg via INTRAVENOUS
  Filled 2018-12-22: qty 1

## 2018-12-22 MED ORDER — LEVALBUTEROL HCL 1.25 MG/0.5ML IN NEBU
1.2500 mg | INHALATION_SOLUTION | Freq: Four times a day (QID) | RESPIRATORY_TRACT | Status: DC | PRN
  Filled 2018-12-22: qty 0.5

## 2018-12-22 MED ORDER — PHENYLEPHRINE HCL (PRESSORS) 10 MG/ML IV SOLN
INTRAVENOUS | Status: DC | PRN
  Administered 2018-12-22 (×3): 200 ug via INTRAVENOUS
  Administered 2018-12-22: 100 ug via INTRAVENOUS
  Administered 2018-12-22 (×3): 200 ug via INTRAVENOUS

## 2018-12-22 MED ORDER — METOPROLOL TARTRATE 5 MG/5ML IV SOLN
5.0000 mg | INTRAVENOUS | Status: DC | PRN
  Administered 2018-12-22 – 2018-12-26 (×2): 5 mg via INTRAVENOUS
  Filled 2018-12-22 (×2): qty 5

## 2018-12-22 MED ORDER — IPRATROPIUM BROMIDE 0.02 % IN SOLN: 0.5000 mg | Freq: Four times a day (QID) | RESPIRATORY_TRACT | Status: DC | PRN

## 2018-12-22 MED ORDER — VANCOMYCIN HCL 10 G IV SOLR
1750.0000 mg | INTRAVENOUS | Status: DC
  Filled 2018-12-22: qty 1750

## 2018-12-22 MED ORDER — VANCOMYCIN HCL 1.5 G IV SOLR
1500.0000 mg | Freq: Once | INTRAVENOUS | Status: AC
  Administered 2018-12-23: 1500 mg via INTRAVENOUS
  Filled 2018-12-22: qty 1500

## 2018-12-22 MED ORDER — SUCCINYLCHOLINE CHLORIDE 20 MG/ML IJ SOLN
INTRAMUSCULAR | Status: DC | PRN
  Administered 2018-12-22: 80 mg via INTRAVENOUS

## 2018-12-22 MED ORDER — MIDAZOLAM HCL 2 MG/2ML IJ SOLN
INTRAMUSCULAR | Status: AC
  Filled 2018-12-22: qty 2

## 2018-12-22 MED ORDER — EPHEDRINE SULFATE 50 MG/ML IJ SOLN
INTRAMUSCULAR | Status: DC | PRN
  Administered 2018-12-22 (×2): 5 mg via INTRAVENOUS

## 2018-12-22 MED ORDER — ONDANSETRON HCL 4 MG/2ML IJ SOLN: 4.0000 mg | Freq: Once | INTRAMUSCULAR | Status: DC | PRN

## 2018-12-22 MED ORDER — SODIUM CHLORIDE 0.9 % IV SOLN
1.0000 g | Freq: Two times a day (BID) | INTRAVENOUS | Status: DC
  Administered 2018-12-23 (×2): 1 g via INTRAVENOUS
  Filled 2018-12-22 (×3): qty 1

## 2018-12-22 MED ORDER — PROPOFOL 10 MG/ML IV BOLUS
INTRAVENOUS | Status: AC
  Filled 2018-12-22: qty 20

## 2018-12-22 MED ORDER — LIDOCAINE HCL (PF) 2 % IJ SOLN
INTRAMUSCULAR | Status: AC
  Filled 2018-12-22: qty 10

## 2018-12-22 MED ORDER — PROPOFOL 10 MG/ML IV BOLUS
INTRAVENOUS | Status: DC | PRN
  Administered 2018-12-22: 50 mg via INTRAVENOUS
  Administered 2018-12-22: 80 mg via INTRAVENOUS

## 2018-12-22 MED ORDER — IPRATROPIUM-ALBUTEROL 0.5-2.5 (3) MG/3ML IN SOLN
3.0000 mL | RESPIRATORY_TRACT | Status: DC | PRN
  Administered 2018-12-22: 22:00:00 3 mL via RESPIRATORY_TRACT
  Filled 2018-12-22: qty 3

## 2018-12-22 MED ORDER — LACTATED RINGERS IV SOLN
INTRAVENOUS | Status: DC
  Administered 2018-12-22: 11:00:00 via INTRAVENOUS

## 2018-12-22 MED ORDER — FUROSEMIDE 10 MG/ML IJ SOLN
10.0000 mg | Freq: Once | INTRAMUSCULAR | Status: AC
  Administered 2018-12-22: 10 mg via INTRAVENOUS
  Filled 2018-12-22: qty 4

## 2018-12-22 MED ORDER — FENTANYL CITRATE (PF) 100 MCG/2ML IJ SOLN
INTRAMUSCULAR | Status: AC
  Filled 2018-12-22: qty 2

## 2018-12-22 MED ORDER — DIPHENHYDRAMINE HCL 50 MG/ML IJ SOLN
12.5000 mg | Freq: Once | INTRAMUSCULAR | Status: AC
  Administered 2018-12-22: 12.5 mg via INTRAVENOUS
  Filled 2018-12-22: qty 1

## 2018-12-22 MED ORDER — HEPARIN SODIUM (PORCINE) 5000 UNIT/ML IJ SOLN
5000.0000 [IU] | Freq: Three times a day (TID) | INTRAMUSCULAR | Status: DC
  Administered 2018-12-22 – 2018-12-26 (×11): 5000 [IU] via SUBCUTANEOUS
  Filled 2018-12-22 (×10): qty 1

## 2018-12-22 MED ORDER — MIDAZOLAM HCL 2 MG/2ML IJ SOLN
INTRAMUSCULAR | Status: DC | PRN
  Administered 2018-12-22: 0.5 mg via INTRAVENOUS

## 2018-12-22 MED ORDER — LIDOCAINE HCL (CARDIAC) PF 100 MG/5ML IV SOSY
PREFILLED_SYRINGE | INTRAVENOUS | Status: DC | PRN
  Administered 2018-12-22: 50 mg via INTRAVENOUS

## 2018-12-22 MED ORDER — INDOMETHACIN 50 MG RE SUPP
RECTAL | Status: AC
  Administered 2018-12-22: 100 mg via RECTAL
  Filled 2018-12-22: qty 2

## 2018-12-22 NOTE — Progress Notes (Signed)
New orders written; RT called for stat ABGs; Barbaraann Faster, RN 11:20 PM2020/10/04

## 2018-12-22 NOTE — Progress Notes (Addendum)
Tele reported as ST with PVC"C rate sustaining 120-130, prime doc paged, Dr Marcille Blanco responded back, orders given

## 2018-12-22 NOTE — Anesthesia Postprocedure Evaluation (Signed)
Anesthesia Post Note  Patient: Zachary Holland  Procedure(s) Performed: ENDOSCOPIC RETROGRADE CHOLANGIOPANCREATOGRAPHY (ERCP) (N/A )  Patient location during evaluation: PACU Anesthesia Type: General Level of consciousness: awake and alert Pain management: pain level controlled Vital Signs Assessment: post-procedure vital signs reviewed and stable Respiratory status: spontaneous breathing, nonlabored ventilation, respiratory function stable and patient connected to nasal cannula oxygen Cardiovascular status: blood pressure returned to baseline and stable Postop Assessment: no apparent nausea or vomiting Anesthetic complications: no     Last Vitals:  Vitals:   12/31/2018 1345 12/15/2018 1400  BP: (P) 101/75   Pulse: 79   Resp: (!) 27   Temp: (P) 37 C (P) 37 C  SpO2: 95%     Last Pain:  Vitals:   12/11/2018 1310  TempSrc:   PainSc: [place]

## 2018-12-22 NOTE — Anesthesia Preprocedure Evaluation (Signed)
Anesthesia Evaluation  Patient identified by MRN, date of birth, ID band Patient awake    Reviewed: Allergy & Precautions, NPO status , Patient's Chart, lab work & pertinent test results, reviewed documented beta blocker date and time   Airway Mallampati: II  TM Distance: >3 FB     Dental  (+) Chipped   Pulmonary           Cardiovascular      Neuro/Psych PSYCHIATRIC DISORDERS Dementia    GI/Hepatic   Endo/Other    Renal/GU      Musculoskeletal   Abdominal   Peds  Hematology   Anesthesia Other Findings Abnormal HR and EKG. seen by Dr. Ubaldo Glassing. Will proceed.  Reproductive/Obstetrics                             Anesthesia Physical Anesthesia Plan  ASA: III  Anesthesia Plan: General   Post-op Pain Management:    Induction: Intravenous  PONV Risk Score and Plan:   Airway Management Planned: Oral ETT  Additional Equipment:   Intra-op Plan:   Post-operative Plan:   Informed Consent: I have reviewed the patients History and Physical, chart, labs and discussed the procedure including the risks, benefits and alternatives for the proposed anesthesia with the patient or authorized representative who has indicated his/her understanding and acceptance.       Plan Discussed with: CRNA  Anesthesia Plan Comments:         Anesthesia Quick Evaluation

## 2018-12-22 NOTE — Progress Notes (Signed)
Respiratory therapist called for PRN Duoneb for SOB, coughing; HR elevated d/t restlessness and fidgeting and trying to get up. Sitter at bedside for safety. Barbaraann Faster, RN 9:56 PM 12/14/2018

## 2018-12-22 NOTE — Progress Notes (Addendum)
[facility] - [place] at [facility]   PATIENT NAME: Zachary Holland    MR#:  [id number]  DATE OF BIRTH:  [birth year]  SUBJECTIVE:  patient has dementia. Poor historian. Came in with altered mental status and vomiting.  Sitter in the room--trying to get out of the bed yday and night--calm currently NPO for ERCP today  REVIEW OF SYSTEMS:   Review of Systems  Unable to perform ROS: Dementia   Tolerating Diet:npo Tolerating PT: pending  DRUG ALLERGIES:   Allergies  Allergen Reactions  . Tramadol Other (See Comments)    Altered mental status     VITALS:  Blood pressure (!) 147/69, pulse (!) 58, temperature 98 F (36.7 C), temperature source Oral, resp. rate 18, height 5\' 10"  (1.778 m), weight 63.5 kg, SpO2 94 %.  PHYSICAL EXAMINATION:   Physical Exam  GENERAL:  85 y.o.-year-old patient lying in the bed with no acute distress. Thin, cachectic+ EYES: Pupils equal, round, reactive to light and accommodation. No scleral icterus.  HEENT: Head atraumatic, normocephalic. Oropharynx and nasopharynx clear.  NECK:  Supple, no jugular venous distention. No thyroid enlargement, no tenderness.  LUNGS: Normal breath sounds bilaterally, no wheezing, rales, rhonchi. No use of accessory muscles of respiration.  CARDIOVASCULAR: S1, S2 normal. No murmurs, rubs, or gallops.  ABDOMEN: Soft, nontender, nondistended. Bowel sounds present. No organomegaly or mass.  EXTREMITIES: No cyanosis, clubbing or edema b/l.    NEUROLOGIC:unable to assess but grossly non focal PSYCHIATRIC:  patient is lethragic SKIN: No obvious rash, lesion, or ulcer.--per RN documentation  LABORATORY PANEL:  CBC Recent Labs  Lab 12/21/18 0445  WBC 8.5  HGB 12.8*  HCT 39.7  PLT 84*    Chemistries  Recent Labs  Lab 12/21/18 0445  NA 142  K 4.2  CL 112*  CO2 21*  GLUCOSE 70  BUN 28*  CREATININE 1.15  CALCIUM 8.1*  AST 163*  ALT 173*  ALKPHOS 205*  BILITOT 7.4*   Cardiac Enzymes No  results for input(s): TROPONINI in the last 168 hours. RADIOLOGY:  Mr Abdomen Wo Contrast  Result Date: 12/21/2018 CLINICAL DATA:  Abnormal LFTs, nausea/vomiting, confusion EXAM: MRI ABDOMEN WITHOUT CONTRAST TECHNIQUE: Multiplanar multisequence MR imaging was performed without the administration of intravenous contrast. COMPARISON:  Right upper quadrant ultrasound dated 12/12/2018 FINDINGS: Severely motion degraded study. Study was also aborted prematurely due to patient discomfort. Lower chest: Lung bases are clear. Hepatobiliary: Small hepatic cysts measuring up to 13 mm in the right hepatic lobe (series 4/image 15). Status post cholecystectomy. No intrahepatic ductal dilatation. Common duct measures 9 mm, within the upper limits of normal for postsurgical appearance. However, there is a possible 6 mm distal CBD stone at the ampulla (series 6/image 7), although poorly visualized/equivocal. Pancreas:  Grossly unremarkable. Spleen:  Within normal limits. Adrenals/Urinary Tract:  Adrenal glands are within normal limits. Kidneys are grossly unremarkable. Fullness of the right renal collecting system. Mild left hydronephrosis with prominent extrarenal pelvis (series 6/image 8). This appearance may be related to bladder distention. Stomach/Bowel: Stomach is within normal limits. Visualized bowel is grossly unremarkable. Vascular/Lymphatic:  No evidence of abdominal aortic aneurysm. No suspicious abdominal lymphadenopathy. Other:  Trace perihepatic ascites. Musculoskeletal: No focal osseous lesions. IMPRESSION: Markedly limited evaluation due to severe motion degradation. Study was also ordered prematurely due to patient discomfort. Status post cholecystectomy. Common duct measures 9 mm, within the upper limits of normal. However, there is a possible 6 mm distal CBD stone at the ampulla, poorly visualized/equivocal.  Consider ERCP as clinically warranted. Mild left hydronephrosis with prominent extrarenal pelvis.  Fullness of the right renal collecting system. This appearance may be related to bladder distension. Electronically Signed   By: Julian Hy M.D.   On: 12/21/2018 10:22   US Abdomen Limited Ruq  Result Date: 12/31/2018 CLINICAL DATA:  Elevated liver function test. EXAM: ULTRASOUND ABDOMEN LIMITED RIGHT UPPER QUADRANT COMPARISON:  None. FINDINGS: Gallbladder: Surgically absent. Common bile duct: Diameter: 10 mm Liver: Increased parenchymal echogenicity. No liver mass or focal lesion. Portal vein is patent on color Doppler imaging with normal direction of blood flow towards the liver. Other: None. IMPRESSION: 1. No acute findings. 2. Increased liver parenchymal echogenicity consistent with hepatic steatosis. 3. Chronic common bile duct dilation.  Status post cholecystectomy. Electronically Signed   By: Lajean Manes M.D.   On: 12/12/2018 11:59   ASSESSMENT AND PLAN:  Zachary Holland is a 85 y.o. male with history of mild dementia, status post cholecystectomy in 05/2018 who is admitted yesterday secondary to altered mental status compared to his baseline according to patient's wife.   1. Choledocholithiasis with possible ascending cholangitis -IV fluids,npo  IV unasyn -G.I. consultation with Dr. Marius Ditch appreciated. -Elevated LFTs with direct bilirubin--obstructive etio--/Stone CBD -MRCP done today shows possible stone in the common bile duct--- will keep patient NPO after midnight. Dr. Allen Norris plans on ERCP -cardiology consultation appreciated with Dr. Ubaldo Glassing-- pt does not have cardiac history (per wife) and ok for GI procedure  2. Mild elevated troponin in the setting of right upper quadrant pain -no cardiac history. -Dr. Ubaldo Glassing input noted  3.Dementia at baseline  4. Malnutrition with h/o dementia -Dietitian to see  Above was discussed with patient's wife she is agreeable with the plan.  Case discussed with Care Management/Social Worker. Management plans discussed with the patient, family  and they are in agreement.  CODE STATUS: DNR  DVT Prophylaxis: heparin on hold for GI procedure  TOTAL TIME TAKING CARE OF THIS PATIENT: *30* minutes.  >50% time spent on counselling and coordination of care  POSSIBLE D/C IN *1-2* DAYS, DEPENDING ON CLINICAL CONDITION.  Note: This dictation was prepared with Dragon dictation along with smaller phrase technology. Any transcriptional errors that result from this process are unintentional.  Fritzi Mandes M.D on 12/09/2018 at 9:14 AM  Between 7am to 6pm - Pager - [phone]  After 6pm go to [email or web address] - Proofreader  [facility]  Office  [phone]  CC: Primary care physician; Administration, VeteransPatient ID: Zachary Holland, male   DOB: [birth year], 85 y.o.   MRN: [id number]

## 2018-12-22 NOTE — Anesthesia Procedure Notes (Signed)
Procedure Name: Intubation Date/Time: 2019-01-03 11:58 AM Performed by: Caryl Asp, CRNA Pre-anesthesia Checklist: Patient identified, Patient being monitored, Timeout performed, Emergency Drugs available and Suction available Patient Re-evaluated:Patient Re-evaluated prior to induction Oxygen Delivery Method: Circle system utilized Preoxygenation: Pre-oxygenation with 100% oxygen Induction Type: IV induction Ventilation: Mask ventilation without difficulty Laryngoscope Size: 3 and McGraph Grade View: Grade I Tube type: Oral Tube size: 7.0 mm Number of attempts: 1 Airway Equipment and Method: Stylet and Video-laryngoscopy Placement Confirmation: ETT inserted through vocal cords under direct vision,  positive ETCO2 and breath sounds checked- equal and bilateral Secured at: 23 cm Tube secured with: Tape Dental Injury: Teeth and Oropharynx as per pre-operative assessment

## 2018-12-22 NOTE — Transfer of Care (Signed)
Immediate Anesthesia Transfer of Care Note  Patient: Zachary Holland  Procedure(s) Performed: ENDOSCOPIC RETROGRADE CHOLANGIOPANCREATOGRAPHY (ERCP) (N/A )  Patient Location: PACU  Anesthesia Type:General  Level of Consciousness: drowsy  Airway & Oxygen Therapy: Patient Spontanous Breathing and Patient connected to face mask oxygen  Post-op Assessment: Report given to RN, Post -op Vital signs reviewed and stable and Patient moving all extremities X 4  Post vital signs: Reviewed and stable  Last Vitals:  Vitals Value Taken Time  BP 96/64 12/12/2018 1240  Temp 36.6 C 12/25/2018 1240  Pulse 64 12/06/2018 1242  Resp 14 12/02/2018 1241  SpO2 99 % 12/22/2018 1242  Vitals shown include unvalidated device data.  Last Pain:  Vitals:   12/05/2018 1240  TempSrc:   PainSc: Asleep      Patients Stated Pain Goal: 0 (21/30/86 5784)  Complications: No apparent anesthesia complications

## 2018-12-22 NOTE — Progress Notes (Signed)
Initial Nutrition Assessment  DOCUMENTATION CODES:   Not applicable  INTERVENTION:   RD will order supplements once diet advanced   NUTRITION DIAGNOSIS:   Inadequate oral intake related to acute illness as evidenced by NPO status.  GOAL:   Patient will meet greater than or equal to 90% of their needs  MONITOR:   Diet advancement, Labs, Weight trends, Skin, I & O's  REASON FOR ASSESSMENT:   Consult Assessment of nutrition requirement/status  ASSESSMENT:   85 y.o. male with history of mild dementia, who is independent of ADLs at baseline, status post cholecystectomy in 05/2018 presented with fever, hypotension found to have obstructive jaundice secondary to choledocholithiasis, also has elevated troponin.   Unable to see patient today as pt in procedure at time of RD visit. Per chart review, pt started vomiting the day of admit. Pt currently NPO today for ERCP. RD will order supplements once diet advanced. RD will obtain nutrition related history and exam at follow up.  There is no weight history in chart to determine if any significant weight loss. Pt is at high risk for malnutrition.    Medications reviewed and include: unasyn, NaCl w/ 5% dextrose @75ml /hr  Labs reviewed: AlkPhos 244(H), AST 80(H), ALT 110(H), Tbili 6.5(H)  Unable to complete Nutrition-Focused physical exam at this time.   Diet Order:   Diet Order            Diet NPO time specified Except for: Ice Chips, Sips with Meds  Diet effective midnight             EDUCATION NEEDS:   No education needs have been identified at this time  Skin:  Skin Assessment: Reviewed RN Assessment(MASD)  Last BM:  9/20- TYPE 2  Height:   Ht Readings from Last 1 Encounters:  December 31, 2018 5\' 10"  (1.778 m)    Weight:   Wt Readings from Last 1 Encounters:  12/28/2018 63.5 kg    Ideal Body Weight:  75.4 kg  BMI:  Body mass index is 20.09 kg/m.  Estimated Nutritional Needs:   Kcal:   1800-2100kcal/day  Protein:  90-105g/day  Fluid:  >1.6L/day  Koleen Distance MS, RD, LDN Pager #- [phone] Office#- [phone] After Hours Pager: [phone]

## 2018-12-22 NOTE — Progress Notes (Signed)
Dr. Jannifer Franklin notified of SOB; tachycardia post lasix and CXR via secure chat; patient repositioned with HOB elevated; Barbaraann Faster, RN 11:07 PM 12/11/2018

## 2018-12-22 NOTE — Anesthesia Post-op Follow-up Note (Signed)
Anesthesia QCDR form completed.        

## 2018-12-22 NOTE — Progress Notes (Addendum)
RN notified MD pt;s lungs sound wet after he come back from ERCP. Per MD okay for RN to DC IVF and and order duo nebs PRN. RN will continue to assess and monitor pt.

## 2018-12-22 NOTE — Op Note (Signed)
[facility]enterology Patient Name: Zachary Holland Procedure Date: 12/13/2018 11:42 AM MRN: [id number] Account #: [id number] Date of Birth: Apr 04, 1932 Admit Type: Inpatient Age: 85 Room: [facility] ENDO ROOM 4 Gender: Male Note Status: Finalized Procedure:            ERCP Indications:          Common bile duct stone(s) Providers:            Midge Minium MD, MD Referring MD:         No Local Md, MD (Referring MD) Medicines:            General Anesthesia Complications:        No immediate complications. Procedure:            Pre-Anesthesia Assessment:                       - Prior to the procedure, a History and Physical was                        performed, and patient medications and allergies were                        reviewed. The patient's tolerance of previous                        anesthesia was also reviewed. The risks and benefits of                        the procedure and the sedation options and risks were                        discussed with the patient. All questions were                        answered, and informed consent was obtained. Prior                        Anticoagulants: The patient has taken no previous                        anticoagulant or antiplatelet agents. ASA Grade                        Assessment: II - A patient with mild systemic disease.                        After reviewing the risks and benefits, the patient was                        deemed in satisfactory condition to undergo the                        procedure.                       After obtaining informed consent, the scope was passed                        under direct vision. Throughout the procedure, the  patient's blood pressure, pulse, and oxygen saturations                        were monitored continuously. The Duodenoscope was                        introduced through the mouth, and used to inject                        contrast into and  used to cannulate the bile duct. The                        ERCP was accomplished without difficulty. The patient                        tolerated the procedure well. Findings:      The scout film was normal. The esophagus was successfully intubated       under direct vision. The scope was advanced to a normal major papilla in       the descending duodenum without detailed examination of the pharynx,       larynx and associated structures, and upper GI tract. The upper GI tract       was grossly normal. The major papilla was located entirely within a       diverticulum. The bile duct was deeply cannulated with the short-nosed       traction sphincterotome. Contrast was injected. I personally interpreted       the bile duct images. There was brisk flow of contrast through the       ducts. Image quality was excellent. Contrast extended to the entire       biliary tree. A wire was passed into the biliary tree. A 7 mm biliary       sphincterotomy was made with a traction (standard) sphincterotome using       ERBE electrocautery. There was no post-sphincterotomy bleeding. The       biliary tree was swept with a 15 mm balloon starting at the bifurcation.       Sludge was swept from the duct. All stones were removed. Nothing was       found. Impression:           - The major papilla was located entirely within a                        diverticulum.                       - Choledocholithiasis was found. Complete removal was                        accomplished by biliary sphincterotomy and balloon                        extraction.                       - A biliary sphincterotomy was performed.                       - The biliary tree was swept and nothing was found. Recommendation:       - Return patient to hospital ward for  ongoing care.                       - Clear liquid diet.                       - Continue present medications.                       - Watch for pancreatitis, bleeding,  perforation, and                        cholangitis.                       - Duodenitis. Procedure Code(s):    --- Professional ---                       [phone], Endoscopic retrograde cholangiopancreatography                        (ERCP); with removal of calculi/debris from                        biliary/pancreatic duct(s)                       43262, Endoscopic retrograde cholangiopancreatography                        (ERCP); with sphincterotomy/papillotomy                       [phone], Endoscopic catheterization of the biliary ductal                        system, radiological supervision and interpretation Diagnosis Code(s):    --- Professional ---                       K80.50, Calculus of bile duct without cholangitis or                        cholecystitis without obstruction CPT copyright 2019 American Medical Association. All rights reserved. The codes documented in this report are preliminary and upon coder review may  be revised to meet current compliance requirements. Lucilla Lame MD, MD 2018-12-30 12:29:34 PM This report has been signed electronically. Number of Addenda: 0 Note Initiated On: 12/30/2018 11:42 AM Estimated Blood Loss: Estimated blood loss: none.      [facility]

## 2018-12-22 NOTE — Progress Notes (Signed)
Pharmacy Antibiotic Note  Zachary Holland is a 85 y.o. male admitted on 01-02-2019 with pneumonia.  Pharmacy has been consulted for vanc/meropenem dosing.  Plan: Will give patient vanc load 1.5g IV load followed by: Vancomycin 1750 mg IV Q 24 hrs. Goal AUC 400-550. Expected AUC: 507.3 SCr used: 1.05 Cssmin: 12.2  Will start meropenem 1g IV q12h per CrCl >30 - < 50 ml/min. Will continue to monitor.  Height: 5\' 10"  (177.8 cm) Weight: 140 lb (63.5 kg) IBW/kg (Calculated) : 73  Temp (24hrs), Avg:98.3 F (36.8 C), Min:97.8 F (36.6 C), Max:98.6 F (37 C)  Recent Labs  Lab 2019-01-02 0832 01-02-2019 0843 01/02/19 1047 2019/01/02 1359 12/21/18 0445 12/19/2018 0855  WBC 5.7  --   --   --  8.5 7.5  CREATININE 1.17  --   --  0.95 1.15 1.05  LATICACIDVEN  --  2.0* 1.9  --   --   --     Estimated Creatinine Clearance: 47 mL/min (by C-G formula based on SCr of 1.05 mg/dL).    Allergies  Allergen Reactions  . Tramadol Other (See Comments)    Altered mental status    Thank you for allowing pharmacy to be a part of this patient's care.  Tobie Lords, PharmD, BCPS Clinical Pharmacist 12/13/2018 11:31 PM

## 2018-12-22 NOTE — Progress Notes (Signed)
Melodie Bouillon, MD [address], [place], [place], [address] [address], [place], [place], [address] Phone: [phone]  Fax: [phone]   Subjective: Patient is status post ERCP today with Dr. Servando Snare and is doing well post procedure.  No abdominal pain.  No nausea or vomiting.  No fever or chills.   Objective: Exam: Vital signs in last 24 hours: Vitals:   12/04/2018 1345 12/20/2018 1400 12/28/2018 1409 12/15/2018 1429  BP: 101/75  100/77 104/73  Pulse: 79 67 74 75  Resp: (!) 27 (!) 27 (!) 25 18  Temp: 98.6 F (37 C) 98.6 F (37 C)  98.1 F (36.7 C)  TempSrc:    Oral  SpO2: 95% 92% 96% 96%  Weight:      Height:       Weight change:   Intake/Output Summary (Last 24 hours) at 12/16/2018 1445 Last data filed at 12/29/2018 1400 Gross per 24 hour  Intake 4037.88 ml  Output 1000 ml  Net 3037.88 ml    General: No acute distress Abd: Soft, NT/ND, No HSM Skin: Warm, no rashes Neck: Supple, Trachea midline   Lab Results: Lab Results  Component Value Date   WBC 7.5 12/13/2018   HGB 13.6 12/04/2018   HCT 41.7 12/13/2018   MCV 90.7 12/14/2018   PLT 97 (L) 12/17/2018   Micro Results: Recent Results (from the past 240 hour(s))  SARS Coronavirus 2 [facility] order, Performed in [facility] hospital lab) Nasopharyngeal Nasopharyngeal Swab     Status: None   Collection Time: 12/14/2018  8:33 AM   Specimen: Nasopharyngeal Swab  Result Value Ref Range Status   SARS Coronavirus 2 NEGATIVE NEGATIVE Final    Comment: (NOTE) If result is NEGATIVE SARS-CoV-2 target nucleic acids are NOT DETECTED. The SARS-CoV-2 RNA is generally detectable in upper and lower  respiratory specimens during the acute phase of infection. The lowest  concentration of SARS-CoV-2 viral copies this assay can detect is 250  copies / mL. A negative result does not preclude SARS-CoV-2 infection  and should not be used as the sole basis for treatment or other  patient management  decisions.  A negative result may occur with  improper specimen collection / handling, submission of specimen other  than nasopharyngeal swab, presence of viral mutation(s) within the  areas targeted by this assay, and inadequate number of viral copies  (<250 copies / mL). A negative result must be combined with clinical  observations, patient history, and epidemiological information. If result is POSITIVE SARS-CoV-2 target nucleic acids are DETECTED. The SARS-CoV-2 RNA is generally detectable in upper and lower  respiratory specimens dur ing the acute phase of infection.  Positive  results are indicative of active infection with SARS-CoV-2.  Clinical  correlation with patient history and other diagnostic information is  necessary to determine patient infection status.  Positive results do  not rule out bacterial infection or co-infection with other viruses. If result is PRESUMPTIVE POSTIVE SARS-CoV-2 nucleic acids MAY BE PRESENT.   A presumptive positive result was obtained on the submitted specimen  and confirmed on repeat testing.  While 2019 novel coronavirus  (SARS-CoV-2) nucleic acids may be present in the submitted sample  additional confirmatory testing may be necessary for epidemiological  and / or clinical management purposes  to differentiate between  SARS-CoV-2 and other Sarbecovirus currently known to infect humans.  If clinically indicated additional testing with an alternate test  methodology [phone]) is advised. The SARS-CoV-2 RNA is generally  detectable in upper and lower respiratory sp ecimens during the acute  phase of infection. The expected result is Negative. Fact Sheet for Patients:  StrictlyIdeas.no Fact Sheet for Healthcare Providers: BankingDealers.co.za This test is not yet approved or cleared by the [place] FDA and has been authorized for detection and/or diagnosis of SARS-CoV-2 by FDA under an  Emergency Use Authorization (EUA).  This EUA will remain in effect (meaning this test can be used) for the duration of the COVID-19 declaration under Section 564(b)(1) of the Act, 21 U.S.C. section 360bbb-3(b)(1), unless the authorization is terminated or revoked sooner. Performed at [facility], [address], [place], [place] [address]   Urine culture     Status: None   Collection Time: 12/12/2018  8:48 AM   Specimen: In/Out Cath Urine  Result Value Ref Range Status   Specimen Description   Final    IN/OUT CATH URINE Performed at [facility], [address], [place], [place] [address]    Special Requests   Final    NONE Performed at [facility], [address] [place], [place] [address]    Culture   Final    NO GROWTH Performed at [facility], [place] [address], [place], [place] [address]    Report Status 12/21/2018 FINAL  Final  Blood Culture (routine x 2)     Status: None (Preliminary result)   Collection Time: 12/28/2018  8:57 AM   Specimen: BLOOD  Result Value Ref Range Status   Specimen Description BLOOD L ARM  Final   Special Requests   Final    BOTTLES DRAWN AEROBIC AND ANAEROBIC Blood Culture adequate volume   Culture   Final    NO GROWTH 2 DAYS Performed at [facility], [address], [place], [place] [address]    Report Status PENDING  Incomplete  Blood Culture (routine x 2)     Status: None (Preliminary result)   Collection Time: 12/28/2018  8:58 AM   Specimen: BLOOD  Result Value Ref Range Status   Specimen Description BLOOD RAC  Final   Special Requests   Final    BOTTLES DRAWN AEROBIC AND ANAEROBIC Blood Culture adequate volume   Culture   Final    NO GROWTH 2 DAYS Performed at [facility], [address], [place],  [address]    Report Status PENDING  Incomplete   Studies/Results: Mr Abdomen Wo Contrast  Result Date: 12/21/2018 CLINICAL DATA:  Abnormal LFTs, nausea/vomiting, confusion  EXAM: MRI ABDOMEN WITHOUT CONTRAST TECHNIQUE: Multiplanar multisequence MR imaging was performed without the administration of intravenous contrast. COMPARISON:  Right upper quadrant ultrasound dated 12/10/2018 FINDINGS: Severely motion degraded study. Study was also aborted prematurely due to patient discomfort. Lower chest: Lung bases are clear. Hepatobiliary: Small hepatic cysts measuring up to 13 mm in the right hepatic lobe (series 4/image 15). Status post cholecystectomy. No intrahepatic ductal dilatation. Common duct measures 9 mm, within the upper limits of normal for postsurgical appearance. However, there is a possible 6 mm distal CBD stone at the ampulla (series 6/image 7), although poorly visualized/equivocal. Pancreas:  Grossly unremarkable. Spleen:  Within normal limits. Adrenals/Urinary Tract:  Adrenal glands are within normal limits. Kidneys are grossly unremarkable. Fullness of the right renal collecting system. Mild left hydronephrosis with prominent extrarenal pelvis (series 6/image 8). This appearance may be related to bladder distention. Stomach/Bowel: Stomach is within normal limits. Visualized bowel is grossly unremarkable. Vascular/Lymphatic:  No evidence of abdominal aortic aneurysm.  No suspicious abdominal lymphadenopathy. Other:  Trace perihepatic ascites. Musculoskeletal: No focal osseous lesions. IMPRESSION: Markedly limited evaluation due to severe motion degradation. Study was also ordered prematurely due to patient discomfort. Status post cholecystectomy. Common duct measures 9 mm, within the upper limits of normal. However, there is a possible 6 mm distal CBD stone at the ampulla, poorly visualized/equivocal. Consider ERCP as clinically warranted. Mild left hydronephrosis with prominent extrarenal pelvis. Fullness of the right renal collecting system. This appearance may be related to bladder distension. Electronically Signed   By: Charline Bills M.D.   On: 12/21/2018 10:22   Dg  C-arm 1-60 Min-no Report  Result Date: 12/29/2018 Fluoroscopy was utilized by the requesting physician.  No radiographic interpretation.   Medications:  Scheduled Meds: . finasteride  5 mg Oral Daily  . heparin injection (subcutaneous)  5,000 Units Subcutaneous Q8H  . montelukast  10 mg Oral Daily   Continuous Infusions: . ampicillin-sulbactam (UNASYN) IV 3 g (12/17/2018 1433)  . dextrose 5 % and 0.9% NaCl 75 mL/hr at 12/14/2018 1431   PRN Meds:.acetaminophen **OR** acetaminophen, albuterol, bisacodyl, ondansetron **OR** ondansetron (ZOFRAN) IV, oxyCODONE, senna-docusate   Assessment: Active Problems:   Sepsis (HCC)   Calculus of common duct    Plan: Choledocholithiasis identified during the ERCP and sphincterotomy and stone extraction performed  Continue daily abdominal exams Continue daily CMP No signs of pancreatitis at this time  Discussed things with patient's wife diane and answered all questions to her satisfaction   LOS: 2 days   Melodie Bouillon, MD 12/09/2018, 2:45 PM

## 2018-12-22 NOTE — Progress Notes (Signed)
Dr. Jannifer Franklin notified, via secure chat of crackles, possible benefit from lasix dose; also ST. Barbaraann Faster, RN 10:10 PM 2019-01-21

## 2018-12-22 NOTE — Progress Notes (Signed)
Pt in PACU, responds to voice. I spoke with Elita Quick, [place] RN, who is taking care of pt today. Elita Quick states he was given in report this morning that patient was up most of the night.  Will continue to assess pt before transporting back to floor.

## 2018-12-23 ENCOUNTER — Encounter: Payer: Self-pay | Admitting: Gastroenterology

## 2018-12-23 ENCOUNTER — Inpatient Hospital Stay (HOSPITAL_COMMUNITY)
Admit: 2018-12-23 | Discharge: 2018-12-23 | Disposition: A | Discharge status: Home / Self Care | Payer: Medicare Other | Attending: Critical Care Medicine | Admitting: Critical Care Medicine

## 2018-12-23 DIAGNOSIS — I361 Nonrheumatic tricuspid (valve) insufficiency: Secondary | ICD-10-CM

## 2018-12-23 DIAGNOSIS — I351 Nonrheumatic aortic (valve) insufficiency: Secondary | ICD-10-CM

## 2018-12-23 LAB — CBC WITH DIFFERENTIAL/PLATELET
Abs Immature Granulocytes: 0.07 10*3/uL (ref 0.00–0.07)
Basophils Absolute: 0 10*3/uL (ref 0.0–0.1)
Basophils Relative: 0 %
Eosinophils Absolute: 0 10*3/uL (ref 0.0–0.5)
Eosinophils Relative: 0 %
HCT: 43.1 % (ref 39.0–52.0)
Hemoglobin: 14.3 g/dL (ref 13.0–17.0)
Immature Granulocytes: 1 %
Lymphocytes Relative: 4 %
Lymphs Abs: 0.3 10*3/uL — ABNORMAL LOW (ref 0.7–4.0)
MCH: 29.3 pg (ref 26.0–34.0)
MCHC: 33.2 g/dL (ref 30.0–36.0)
MCV: 88.3 fL (ref 80.0–100.0)
Monocytes Absolute: 0.6 10*3/uL (ref 0.1–1.0)
Monocytes Relative: 8 %
Neutro Abs: 7.2 10*3/uL (ref 1.7–7.7)
Neutrophils Relative %: 87 %
Platelets: 110 10*3/uL — ABNORMAL LOW (ref 150–400)
RBC: 4.88 MIL/uL (ref 4.22–5.81)
RDW: 15.3 % (ref 11.5–15.5)
WBC: 8.3 10*3/uL (ref 4.0–10.5)
nRBC: 0 % (ref 0.0–0.2)

## 2018-12-23 LAB — ECHOCARDIOGRAM COMPLETE
Height: 70 in
Weight: 2240.01 oz

## 2018-12-23 LAB — PROCALCITONIN
Procalcitonin: 7.99 ng/mL
Procalcitonin: 6.79 ng/mL

## 2018-12-23 LAB — MRSA PCR SCREENING: MRSA by PCR: NEGATIVE

## 2018-12-23 LAB — CORTISOL: Cortisol, Plasma: 40.2 ug/dL

## 2018-12-23 LAB — COMPREHENSIVE METABOLIC PANEL
ALT: 79 U/L — ABNORMAL HIGH (ref 0–44)
AST: 68 U/L — ABNORMAL HIGH (ref 15–41)
Albumin: 3.2 g/dL — ABNORMAL LOW (ref 3.5–5.0)
Alkaline Phosphatase: 250 U/L — ABNORMAL HIGH (ref 38–126)
Anion gap: 11 (ref 5–15)
BUN: 25 mg/dL — ABNORMAL HIGH (ref 8–23)
CO2: 21 mmol/L — ABNORMAL LOW (ref 22–32)
Calcium: 8 mg/dL — ABNORMAL LOW (ref 8.9–10.3)
Chloride: 113 mmol/L — ABNORMAL HIGH (ref 98–111)
Creatinine, Ser: 1.13 mg/dL (ref 0.61–1.24)
GFR calc Af Amer: >60 mL/min (ref >60)
GFR calc non Af Amer: 59 mL/min — ABNORMAL LOW (ref >60)
Glucose, Bld: 130 mg/dL — ABNORMAL HIGH (ref 70–99)
Potassium: 3.5 mmol/L (ref 3.5–5.1)
Sodium: 145 mmol/L (ref 135–145)
Total Bilirubin: 7.6 mg/dL — ABNORMAL HIGH (ref 0.3–1.2)
Total Protein: 6 g/dL — ABNORMAL LOW (ref 6.5–8.1)

## 2018-12-23 LAB — IMMATURE PLATELET FRACTION: Immature Platelet Fraction: 6.2 % (ref 1.2–8.6)

## 2018-12-23 LAB — LIPASE, BLOOD: Lipase: 12 U/L (ref 11–51)

## 2018-12-23 LAB — GLUCOSE, CAPILLARY: Glucose-Capillary: 109 mg/dL — ABNORMAL HIGH (ref 70–99)

## 2018-12-23 LAB — PHOSPHORUS: Phosphorus: 2.3 mg/dL — ABNORMAL LOW (ref 2.5–4.6)

## 2018-12-23 LAB — MAGNESIUM: Magnesium: 1.9 mg/dL (ref 1.7–2.4)

## 2018-12-23 MED ORDER — NOREPINEPHRINE 4 MG/250ML-% IV SOLN
0.0000 ug/min | INTRAVENOUS | Status: DC
  Administered 2018-12-23: 07:00:00 5 ug/min via INTRAVENOUS

## 2018-12-23 MED ORDER — MAGNESIUM SULFATE IN D5W 1-5 GM/100ML-% IV SOLN
1.0000 g | Freq: Once | INTRAVENOUS | Status: AC
  Administered 2018-12-23: 1 g via INTRAVENOUS
  Filled 2018-12-23: qty 100

## 2018-12-23 MED ORDER — POTASSIUM PHOSPHATES 15 MMOLE/5ML IV SOLN
10.0000 mmol | Freq: Once | INTRAVENOUS | Status: AC
  Administered 2018-12-23: 08:00:00 10 mmol via INTRAVENOUS
  Filled 2018-12-23: qty 3.33

## 2018-12-23 MED ORDER — FAMOTIDINE IN NACL 20-0.9 MG/50ML-% IV SOLN
20.0000 mg | Freq: Every day | INTRAVENOUS | Status: DC
  Administered 2018-12-23 – 2018-12-24 (×2): 20 mg via INTRAVENOUS
  Filled 2018-12-23 (×2): qty 50

## 2018-12-23 MED ORDER — LORAZEPAM 2 MG/ML IJ SOLN: 1.0000 mg | Freq: Once | INTRAMUSCULAR | Status: DC

## 2018-12-23 MED ORDER — PIPERACILLIN-TAZOBACTAM 3.375 G IVPB
3.3750 g | Freq: Three times a day (TID) | INTRAVENOUS | Status: DC
  Administered 2018-12-24 – 2018-12-26 (×7): 3.375 g via INTRAVENOUS
  Filled 2018-12-23 (×7): qty 50

## 2018-12-23 MED ORDER — HYDROCORTISONE NA SUCCINATE PF 100 MG IJ SOLR
50.0000 mg | Freq: Four times a day (QID) | INTRAMUSCULAR | Status: DC
  Administered 2018-12-23 – 2018-12-25 (×8): 50 mg via INTRAVENOUS
  Filled 2018-12-23 (×8): qty 2

## 2018-12-23 MED ORDER — FUROSEMIDE 10 MG/ML IJ SOLN
40.0000 mg | Freq: Once | INTRAMUSCULAR | Status: AC
  Administered 2018-12-23: 40 mg via INTRAVENOUS
  Filled 2018-12-23: qty 4

## 2018-12-23 MED ORDER — DEXMEDETOMIDINE HCL IN NACL 400 MCG/100ML IV SOLN
0.4000 ug/kg/h | INTRAVENOUS | Status: DC
  Administered 2018-12-23: 10:00:00 0.4 ug/kg/h via INTRAVENOUS
  Administered 2018-12-23: 05:00:00 1 ug/kg/h via INTRAVENOUS
  Administered 2018-12-24: 0.3 ug/kg/h via INTRAVENOUS
  Administered 2018-12-25: 11:00:00 0.6 ug/kg/h via INTRAVENOUS
  Administered 2018-12-25: 08:00:00 1 ug/kg/h via INTRAVENOUS
  Filled 2018-12-23 (×4): qty 100

## 2018-12-23 MED ORDER — POTASSIUM CHLORIDE 10 MEQ/100ML IV SOLN
10.0000 meq | INTRAVENOUS | Status: DC
  Filled 2018-12-23 (×3): qty 100

## 2018-12-23 MED ORDER — POTASSIUM CHLORIDE 10 MEQ/100ML IV SOLN
10.0000 meq | INTRAVENOUS | Status: DC
  Filled 2018-12-23 (×2): qty 100

## 2018-12-23 MED ORDER — NOREPINEPHRINE-SODIUM CHLORIDE 4-0.9 MG/250ML-% IV SOLN
INTRAVENOUS | Status: AC
  Administered 2018-12-23: 07:00:00 5 ug/min via INTRAVENOUS
  Filled 2018-12-23: qty 250

## 2018-12-23 MED ORDER — FENTANYL CITRATE (PF) 100 MCG/2ML IJ SOLN
12.5000 ug | INTRAMUSCULAR | Status: DC | PRN
  Administered 2018-12-23 (×2): 25 ug via INTRAVENOUS
  Administered 2018-12-24: 12.5 ug via INTRAVENOUS
  Administered 2018-12-26 (×3): 25 ug via INTRAVENOUS
  Filled 2018-12-23 (×5): qty 2

## 2018-12-23 MED ORDER — VANCOMYCIN HCL 10 G IV SOLR
1750.0000 mg | INTRAVENOUS | Status: DC
  Filled 2018-12-23: qty 1750

## 2018-12-23 MED ORDER — POTASSIUM CHLORIDE 10 MEQ/100ML IV SOLN
10.0000 meq | INTRAVENOUS | Status: AC
  Administered 2018-12-23 (×2): 10 meq via INTRAVENOUS
  Filled 2018-12-23 (×3): qty 100

## 2018-12-23 MED ORDER — FENTANYL CITRATE (PF) 100 MCG/2ML IJ SOLN
INTRAMUSCULAR | Status: AC
  Administered 2018-12-23: 25 ug via INTRAVENOUS
  Filled 2018-12-23: qty 2

## 2018-12-23 MED ORDER — OXYCODONE HCL 5 MG PO TABS
5.0000 mg | ORAL_TABLET | ORAL | Status: DC | PRN
  Administered 2018-12-24 – 2018-12-25 (×2): 5 mg via ORAL
  Filled 2018-12-23 (×2): qty 1

## 2018-12-23 MED ORDER — CHLORHEXIDINE GLUCONATE CLOTH 2 % EX PADS
6.0000 | MEDICATED_PAD | Freq: Every day | CUTANEOUS | Status: DC
  Administered 2018-12-24 – 2018-12-26 (×3): 6 via TOPICAL

## 2018-12-23 NOTE — Progress Notes (Signed)
CRITICAL CARE PROGRESS NOTE    Name: Zachary Holland MRN: [id number] DOB: 1932/06/02     LOS: 3   SUBJECTIVE FINDINGS & SIGNIFICANT EVENTS   Patient description:   85 yo male with limited medical history including mild cognitive impairment brought in for confusion in setting of febrile illness. Found to have cholestatic transaminitis s/p ERCP w/sphinctertotomy for choledocholithiasis.  Once patient on hospital floor post procedure he was noted to have labored breathing and    Lines / Drains: PIV   Cultures / Sepsis markers: Blood cx ntd  Antibiotics: Narrowed to Zosyn    Protocols / Consultants: GI  Tests / Events: S/p ERCP  Overnight: aggitation requiring sitter, remains confused.    PAST MEDICAL HISTORY   Past Medical History:  Diagnosis Date  . [facility])      SURGICAL HISTORY   Past Surgical History:  Procedure Laterality Date  . CHOLECYSTECTOMY    . ERCP N/A 2019-01-05   Procedure: ENDOSCOPIC RETROGRADE CHOLANGIOPANCREATOGRAPHY (ERCP);  Surgeon: Midge Minium, MD;  Location: [facility] ENDOSCOPY;  Service: Endoscopy;  Laterality: N/A;     FAMILY HISTORY   History reviewed. No pertinent family history.   SOCIAL HISTORY   Social History   Tobacco Use  . Smoking status: Never Smoker  . Smokeless tobacco: Never Used  Substance Use Topics  . Alcohol use: Not on file  . Drug use: Not on file     MEDICATIONS   Current Medication:  Current Facility-Administered Medications:  .  acetaminophen (TYLENOL) tablet 650 mg, 650 mg, Oral, Q6H PRN, 650 mg at 12/30/2018 1434 **OR** acetaminophen (TYLENOL) suppository 650 mg, 650 mg, Rectal, Q6H PRN, Servando Snare, Darren, MD .  bisacodyl (DULCOLAX) EC tablet 5 mg, 5 mg, Oral, Daily PRN, Servando Snare, Darren, MD .  Chlorhexidine Gluconate Cloth 2 %  PADS 6 each, 6 each, Topical, Daily, Blakeney, Dana G, NP .  dexmedetomidine (PRECEDEX) 400 MCG/100ML (4 mcg/mL) infusion, 0.4-1.2 mcg/kg/hr, Intravenous, Titrated, Blakeney, Dana G, NP, Last Rate: 9.53 mL/hr at 12/23/18 1020, 0.6 mcg/kg/hr at 12/23/18 1020 .  fentaNYL (SUBLIMAZE) injection 12.5-25 mcg, 12.5-25 mcg, Intravenous, Q4H PRN, Eugenie Norrie, NP, 25 mcg at 12/23/18 3212 .  finasteride (PROSCAR) tablet 5 mg, 5 mg, Oral, Daily, Servando Snare, Darren, MD, 5 mg at 2019-01-05 0921 .  heparin injection 5,000 Units, 5,000 Units, Subcutaneous, Q8H, Enedina Finner, MD, 5,000 Units at 12/23/18 [phone] .  ipratropium (ATROVENT) nebulizer solution 0.5 mg, 0.5 mg, Nebulization, Q6H PRN, Oralia Manis, MD .  levalbuterol [facility]) nebulizer solution 1.25 mg, 1.25 mg, Nebulization, Q6H PRN, Oralia Manis, MD .  metoprolol tartrate (LOPRESSOR) injection 5 mg, 5 mg, Intravenous, Q4H PRN, Enedina Finner, MD, 5 mg at January 05, 2019 1556 .  norepinephrine (LEVOPHED) 4mg  in premix infusion, 0-40 mcg/min, Intravenous, Titrated, Eugenie Norrie, NP, Stopped at 12/23/18 [phone] .  ondansetron (ZOFRAN) tablet 4 mg, 4 mg, Oral, Q6H PRN **OR** ondansetron (ZOFRAN) injection 4 mg, 4 mg, Intravenous, Q6H PRN, Servando Snare, Darren, MD .  oxyCODONE (Oxy IR/ROXICODONE) immediate release tablet 5 mg, 5 mg, Oral, Q4H PRN, [place] Caster, Dana G, NP .  piperacillin-tazobactam (ZOSYN) IVPB 3.375 g, 3.375 g, Intravenous, Q8H, Odette Watanabe, MD    ALLERGIES   Tramadol    REVIEW OF SYSTEMS     Unable to obtain   PHYSICAL EXAMINATION   Vital Signs: Temp:  [97.8 F (36.6 C)-98.6 F (37 C)] 98.3 F (36.8 C) (09/22 0749) Pulse Rate:  [56-133] 81 (09/22 1000) Resp:  [10-36] 27 (09/22 1000)  BP: (64-136)/(51-118) 104/80 (09/22 1000) SpO2:  [86 %-100 %] 95 % (09/22 1000)  GENERAL:confused age apporpriate  HEAD: Normocephalic, atraumatic.  EYES: Pupils equal, round, reactive to light.  No scleral icterus.  MOUTH: Moist mucosal membrane.  NECK: Supple. No thyromegaly. No nodules. No JVD.  PULMONARY: bilateral rhonchi and crackles at bases b/l CARDIOVASCULAR: S1 and S2. Regular rate and rhythm. No murmurs, rubs, or gallops.  GASTROINTESTINAL: Soft, nontender, non-distended. No masses. Positive bowel sounds. No hepatosplenomegaly.  MUSCULOSKELETAL: No swelling, clubbing, or edema.  NEUROLOGIC: Mild distress due to acute illness SKIN:intact,warm,dry   PERTINENT DATA     Infusions: . dexmedetomidine (PRECEDEX) IV infusion 0.6 mcg/kg/hr (12/23/18 1020)  . norepinephrine (LEVOPHED) Adult infusion Stopped (12/23/18 0657)  . piperacillin-tazobactam (ZOSYN)  IV     Scheduled Medications: . Chlorhexidine Gluconate Cloth  6 each Topical Daily  . finasteride  5 mg Oral Daily  . heparin injection (subcutaneous)  5,000 Units Subcutaneous Q8H   PRN Medications: acetaminophen **OR** acetaminophen, bisacodyl, fentaNYL (SUBLIMAZE) injection, ipratropium, levalbuterol, metoprolol tartrate, ondansetron **OR** ondansetron (ZOFRAN) IV, oxyCODONE Hemodynamic parameters:   Intake/Output: 09/21 0701 - 09/22 0700 In: 4401 [P.O.:130; I.V.:675.5; IV Piggyback:453.5] Out: 2700 [Urine:2700]  Ventilator  Settings:      LAB RESULTS:  Basic Metabolic Panel: Recent Labs  Lab 2018/12/28 0832 12/28/2018 1359 12/21/18 0445 12/19/2018 0855 12/23/18 0409  NA 142  --  142 142 145  K 3.6  --  4.2 3.9 3.5  CL 105  --  112* 109 113*  CO2 29  --  21* 24 21*  GLUCOSE 88  --  70 118* 130*  BUN 21  --  28* 21 25*  CREATININE 1.17 0.95 1.15 1.05 1.13  CALCIUM 9.1  --  8.1* 8.1* 8.0*  MG  --   --   --   --  1.9  PHOS  --   --   --   --  2.3*   Liver Function Tests: Recent Labs  Lab 12/28/2018 0832 12/21/18 0445 12/17/2018 0855 12/23/18 0409  AST 514* 163* 80* 68*  ALT 315* 173* 110* 79*  ALKPHOS 281* 205* 244* 250*  BILITOT 3.5* 7.4* 6.5* 7.6*  PROT 6.5 5.5* 6.1* 6.0*  ALBUMIN 3.9 3.1* 3.4* 3.2*   Recent Labs  Lab 12/21/18 0445  12/23/18 0409  LIPASE 17 12   No results for input(s): AMMONIA in the last 168 hours. CBC: Recent Labs  Lab 12-28-2018 0832 12/21/18 0445 12/03/2018 0855 12/23/18 0409  WBC 5.7 8.5 7.5 8.3  NEUTROABS  --   --   --  7.2  HGB 14.7 12.8* 13.6 14.3  HCT 45.0 39.7 41.7 43.1  MCV 91.1 91.1 90.7 88.3  PLT 90* 84* 97* 110*   Cardiac Enzymes: No results for input(s): CKTOTAL, CKMB, CKMBINDEX, TROPONINI in the last 168 hours. BNP: Invalid input(s): POCBNP CBG: Recent Labs  Lab 12/21/18 1546 12/21/18 1605 12/23/18 0220  GLUCAP 56* 102* 109*     IMAGING RESULTS:  Imaging: Dg Chest Port 1 View  Result Date: 12/08/2018 CLINICAL DATA:  Cough EXAM: PORTABLE CHEST 1 VIEW COMPARISON:  12/28/18 FINDINGS: Heart is normal size. Diffuse bilateral airspace disease. Possible small bilateral effusions. No acute bony abnormality. Old healed left rib fractures. IMPRESSION: Diffuse bilateral airspace disease with normal heart size. Findings concerning for pneumonia. Possible small bilateral effusions. Electronically Signed   By: Rolm Baptise M.D.   On: 12/11/2018 22:56   Dg C-arm 1-60 Min-no Report  Result Date: 12/29/2018 Fluoroscopy was  utilized by the requesting physician.  No radiographic interpretation.      ASSESSMENT AND PLAN    -Multidisciplinary rounds held today  Acute Hypoxic Respiratory Failure -bilateral airspace disease worse on right per CXR on MICU admission -poss AECHF - TTE in process -poss infectious pneumonia despite normal WBC count due to confusion, elevation of PCT and abnormal CXR -c/w empiric zosyn    Septic encephalopathy - treating underlying cause -currently thought due to pneumonia vs GI source  - continue with Zosyn  -has 1:1 sitter , mitts on UEx2    Obstructive Uropathy  - continue home proscar -s/p foley catheter - has been making adequate urine   Sepsis  -due to Pneumonia vs GI source  -follow up cultures- ntd -emperic zosyn  -consider  stress dose steroids-cortisol in process   ID -continue IV abx as prescibed -follow up cultures   GI/Nutrition GI PROPHYLAXIS as indicated DIET-->TF's as tolerated Constipation protocol as indicated   ENDO - ICU hypoglycemic\Hyperglycemia protocol -check FSBS per protocol   ELECTROLYTES -follow labs as needed -replace as needed -pharmacy consultation   DVT/GI PRX ordered -heparin 5k [place] q8h TRANSFUSIONS AS NEEDED MONITOR FSBS ASSESS the need for LABS as needed    Critical care provider statement:    Critical care time (minutes):  42   Critical care time was exclusive of:  Separately billable procedures and treating other patients   Critical care was necessary to treat or prevent imminent or life-threatening deterioration of the following conditions:  sepsis , pneumonia, choledocholithiasis, encephalopathy   Critical care was time spent personally by me on the following activities:  Development of treatment plan with patient or surrogate, discussions with consultants, evaluation of patient's response to treatment, examination of patient, obtaining history from patient or surrogate, ordering and performing treatments and interventions, ordering and review of laboratory studies and re-evaluation of patient's condition.  I assumed direction of critical care for this patient from another provider in my specialty: no    This document was prepared using Dragon voice recognition software and may include unintentional dictation errors.    Vida Rigger, M.D.  Division of Pulmonary & Critical Care Medicine  [facility]

## 2018-12-23 NOTE — Progress Notes (Signed)
Zachary Antigua, MD [address], [place], [place], [place], [address] [id number], [place], [place], [place], [address] Phone: [phone]  Fax: [phone]   Subjective:  Patient in ICU at this time due to shortness of breath. He is confused today. Afebrile  Objective: Exam: Vital signs in last 24 hours: Vitals:   12/23/18 0749 12/23/18 0800 12/23/18 0900 12/23/18 1000  BP:  97/72 (!) 95/54 104/80  Pulse:  (!) 56 68 81  Resp:  (!) 34 (!) 29 (!) 27  Temp: 98.3 F (36.8 C)     TempSrc: Axillary     SpO2:  96% 96% 95%  Weight:      Height:       Weight change:   Intake/Output Summary (Last 24 hours) at 12/23/2018 1034 Last data filed at 12/23/2018 1020 Gross per 24 hour  Intake 1390.66 ml  Output 2700 ml  Net -1309.34 ml    General: No acute distress Abd: Soft, NT/ND, No HSM Skin: Warm, no rashes Neck: Supple, Trachea midline   Lab Results: Lab Results  Component Value Date   WBC 8.3 12/23/2018   HGB 14.3 12/23/2018   HCT 43.1 12/23/2018   MCV 88.3 12/23/2018   PLT 110 (L) 12/23/2018   Micro Results: Recent Results (from the past 240 hour(s))  SARS Coronavirus 2 [facility] order, Performed in [facility] hospital lab) Nasopharyngeal Nasopharyngeal Swab     Status: None   Collection Time: 12/21/2018  8:33 AM   Specimen: Nasopharyngeal Swab  Result Value Ref Range Status   SARS Coronavirus 2 NEGATIVE NEGATIVE Final    Comment: (NOTE) If result is NEGATIVE SARS-CoV-2 target nucleic acids are NOT DETECTED. The SARS-CoV-2 RNA is generally detectable in upper and lower  respiratory specimens during the acute phase of infection. The lowest  concentration of SARS-CoV-2 viral copies this assay can detect is 250  copies / mL. A negative result does not preclude SARS-CoV-2 infection  and should not be used as the sole basis for treatment or other  patient management decisions.  A negative result may occur with  improper specimen collection / handling,  submission of specimen other  than nasopharyngeal swab, presence of viral mutation(s) within the  areas targeted by this assay, and inadequate number of viral copies  (<250 copies / mL). A negative result must be combined with clinical  observations, patient history, and epidemiological information. If result is POSITIVE SARS-CoV-2 target nucleic acids are DETECTED. The SARS-CoV-2 RNA is generally detectable in upper and lower  respiratory specimens dur ing the acute phase of infection.  Positive  results are indicative of active infection with SARS-CoV-2.  Clinical  correlation with patient history and other diagnostic information is  necessary to determine patient infection status.  Positive results do  not rule out bacterial infection or co-infection with other viruses. If result is PRESUMPTIVE POSTIVE SARS-CoV-2 nucleic acids MAY BE PRESENT.   A presumptive positive result was obtained on the submitted specimen  and confirmed on repeat testing.  While 2019 novel coronavirus  (SARS-CoV-2) nucleic acids may be present in the submitted sample  additional confirmatory testing may be necessary for epidemiological  and / or clinical management purposes  to differentiate between  SARS-CoV-2 and other Sarbecovirus currently known to infect humans.  If clinically indicated additional testing with an alternate test  methodology [phone]) is advised. The SARS-CoV-2 RNA is generally  detectable in upper and lower respiratory sp ecimens during the acute  phase of infection. The expected  result is Negative. Fact Sheet for Patients:  BoilerBrush.com.cy Fact Sheet for Healthcare Providers: https://pope.com/ This test is not yet approved or cleared by the [place] FDA and has been authorized for detection and/or diagnosis of SARS-CoV-2 by FDA under an Emergency Use Authorization (EUA).  This EUA will remain in effect (meaning this test can be  used) for the duration of the COVID-19 declaration under Section 564(b)(1) of the Act, 21 U.S.C. section 360bbb-3(b)(1), unless the authorization is terminated or revoked sooner. Performed at [facility], [address] [place], [place] [address]   Urine culture     Status: None   Collection Time: 12/19/2018  8:48 AM   Specimen: In/Out Cath Urine  Result Value Ref Range Status   Specimen Description   Final    IN/OUT CATH URINE Performed at [facility], [address] [place], [place] [address]    Special Requests   Final    NONE Performed at [facility], [address] [place], [place] [address]    Culture   Final    NO GROWTH Performed at [facility], [address], [place], [place] [address]    Report Status 12/21/2018 FINAL  Final  Blood Culture (routine x 2)     Status: None (Preliminary result)   Collection Time: 12/16/2018  8:57 AM   Specimen: BLOOD  Result Value Ref Range Status   Specimen Description BLOOD L ARM  Final   Special Requests   Final    BOTTLES DRAWN AEROBIC AND ANAEROBIC Blood Culture adequate volume   Culture   Final    NO GROWTH 3 DAYS Performed at [facility], [address] [place], [place] [address]    Report Status PENDING  Incomplete  Blood Culture (routine x 2)     Status: None (Preliminary result)   Collection Time: 12/12/2018  8:58 AM   Specimen: BLOOD  Result Value Ref Range Status   Specimen Description BLOOD RAC  Final   Special Requests   Final    BOTTLES DRAWN AEROBIC AND ANAEROBIC Blood Culture adequate volume   Culture   Final    NO GROWTH 3 DAYS Performed at [facility], [address], [place], [place] [address]    Report Status PENDING  Incomplete   Studies/Results: Dg Chest Port 1 View  Result Date: 12/21/2018 CLINICAL DATA:  Cough EXAM: PORTABLE CHEST 1 VIEW COMPARISON:  12/06/2018 FINDINGS: Heart is normal size. Diffuse bilateral airspace disease. Possible  small bilateral effusions. No acute bony abnormality. Old healed left rib fractures. IMPRESSION: Diffuse bilateral airspace disease with normal heart size. Findings concerning for pneumonia. Possible small bilateral effusions. Electronically Signed   By: Charlett Nose M.D.   On: 12/07/2018 22:56   Dg C-arm 1-60 Min-no Report  Result Date: 12/13/2018 Fluoroscopy was utilized by the requesting physician.  No radiographic interpretation.   Medications:  Scheduled Meds: . Chlorhexidine Gluconate Cloth  6 each Topical Daily  . finasteride  5 mg Oral Daily  . heparin injection (subcutaneous)  5,000 Units Subcutaneous Q8H  . montelukast  10 mg Oral Daily   Continuous Infusions: . dexmedetomidine (PRECEDEX) IV infusion 0.6 mcg/kg/hr (12/23/18 1020)  . meropenem (MERREM) IV 200 mL/hr at 12/23/18 1020  . norepinephrine (LEVOPHED) Adult infusion Stopped (12/23/18 0657)  . [START ON 12/24/2018] vancomycin     PRN Meds:.acetaminophen **OR** acetaminophen, bisacodyl, fentaNYL (SUBLIMAZE) injection, ipratropium, levalbuterol, metoprolol tartrate, ondansetron **OR** ondansetron (ZOFRAN) IV, oxyCODONE   Assessment: Active  Problems:   Sepsis (HCC)   Calculus of common duct    Plan: Bilirubin is still elevated today Patient remains afebrile over last 24 hours Continue gram-negative coverage  Repeat CMP tomorrow as persistent elevation in Bilirubin may be transient and is expected to improve given that stones were removed and sphincterotomy was performed yesterday. Dr. Servando Snare aware of the current labs as well.    LOS: 3 days   Melodie Bouillon, MD 12/23/2018, 10:34 AM

## 2018-12-23 NOTE — Progress Notes (Signed)
Name: Zachary Holland MRN: [id number] DOB: 03/29/1933     CONSULTATION DATE: 12/23/18  CHIEF COMPLAINT:  Altered mental status  SIGNIFICANT EVENTS/STUDIES:  09/19-Pt admitted to the medsurg unit with pneumonia  09/19-US Abd Limited revealed no acute findings, increased liver parenchymal echogenicity consistent with hepatic steatosis, chronic common bile duct dilation, and status post cholecystectomy. 09/20-MRI Abd revealed markedly limited evaluation due to severe motion degradation. Study was also ordered prematurely due to patient discomfort. Status post cholecystectomy. Common duct measures 9 mm, within the upper limits of normal. However, there is a possible 6 mm distal CBD stone at the ampulla, poorly visualized/equivocal. Consider ERCP as clinically warranted. Mild left hydronephrosis with prominent extrarenal pelvis. Fullness of the right renal collecting system. This appearance may be related to bladder distension. Gastroenterology consulted recommended ERCP  09/21-ERCP revealed the major papilla was located entirely within a diverticulum, choledocholithiasis was found, complete removal was accomplished by biliary sphincterotomy and balloon extraction, a biliary sphincterotomy was performed, and the biliary tree was swept and nothing was found. 09/22-Pt with worsening acute hypoxic respiratory failure CXR worsening pneumonia and pulmonary edema requiring Bipap    HISTORY OF PRESENT ILLNESS:  Zachary Holland is an 85 yo male with PMH of mild dementia. He presented to the ER on 9/19 for AMS and was found to have possible pneumonia on CXR an elevated LFTs. He was transferred to [facility] and an MRI revealed distal CBD stone. ERCP revealed choledocholithiasis, stone was removed and biliary sphincterotomy was performed. On 9/22, pt became more agitated and had respiratory distress which required BiPAP and transfer to ICU. New CXR concerning for worsening pneumonia.   PAST MEDICAL HISTORY :   has a past medical history of Dementia (HCC).  has a past surgical history that includes Cholecystectomy and ERCP (N/A, 12/10/2018). Prior to Admission medications   Medication Sig Start Date End Date Taking? Authorizing Provider  cholecalciferol (VITAMIN D3) 25 MCG (1000 UT) tablet Take 1,000 Units by mouth daily.   Yes [provider]  finasteride (PROSCAR) 5 MG tablet Take 5 mg by mouth daily.   Yes [provider]  montelukast (SINGULAIR) 10 MG tablet Take 10 mg by mouth daily.   Yes [provider]  oxyCODONE (OXY IR/ROXICODONE) 5 MG immediate release tablet Take 5 mg by mouth 2 (two) times daily as needed for moderate pain or severe pain.   Yes [provider]  pregabalin (LYRICA) 100 MG capsule Take 100 mg by mouth 2 (two) times daily.   Yes [provider]  vitamin B-12 (CYANOCOBALAMIN) 1000 MCG tablet Take 1,000 mcg by mouth daily.   Yes [provider]   Allergies  Allergen Reactions  . Tramadol Other (See Comments)    Altered mental status    REVIEW OF SYSTEMS:   Unable to obtain due to altered mental status and dementia   VITAL SIGNS: Temp:  [97.8 F (36.6 C)-98.6 F (37 C)] 98.3 F (36.8 C) (09/22 0749) Pulse Rate:  [56-133] 81 (09/22 1000) Resp:  [10-36] 27 (09/22 1000) BP: (64-136)/(51-118) 104/80 (09/22 1000) SpO2:  [86 %-100 %] 95 % (09/22 1000)   I/O last 3 completed shifts: In: 2197.4 [P.O.:130; I.V.:1413.9; IV Piggyback:653.5] Out: 2700 [Urine:2700] Total I/O In: 131.7 [I.V.:11.3; IV Piggyback:120.4] Out: -    SpO2: 95 % O2 Flow Rate (L/min): 2 L/min   Physical Examination:  GENERAL:critically ill appearing, -resp distress HEAD: Normocephalic, atraumatic.  EYES: Pupils equal, round, reactive to light.  No scleral icterus.  MOUTH: Moist mucosal membrane. NECK: Supple. No JVD.  PULMONARY: +crackles bilaterally CARDIOVASCULAR: S1 and S2. Regular rate and rhythm. No murmurs, rubs, or gallops.   GASTROINTESTINAL: Soft, nontender, -distended. No masses. Positive bowel sounds. No hepatosplenomegaly.  MUSCULOSKELETAL: No swelling, clubbing, or edema.  NEUROLOGIC: opens eyes to voice, responds intermittently SKIN:intact,warm,dry  I personally reviewed lab work that was obtained in last 24 hrs. CXR Independently reviewed  MEDICATIONS: I have reviewed all medications and confirmed regimen as documented   CULTURE RESULTS   Recent Results (from the past 240 hour(s))  SARS Coronavirus 2 [facility] order, Performed in [facility] hospital lab) Nasopharyngeal Nasopharyngeal Swab     Status: None   Collection Time: 12/13/2018  8:33 AM   Specimen: Nasopharyngeal Swab  Result Value Ref Range Status   SARS Coronavirus 2 NEGATIVE NEGATIVE Final    Comment: (NOTE) If result is NEGATIVE SARS-CoV-2 target nucleic acids are NOT DETECTED. The SARS-CoV-2 RNA is generally detectable in upper and lower  respiratory specimens during the acute phase of infection. The lowest  concentration of SARS-CoV-2 viral copies this assay can detect is 250  copies / mL. A negative result does not preclude SARS-CoV-2 infection  and should not be used as the sole basis for treatment or other  patient management decisions.  A negative result may occur with  improper specimen collection / handling, submission of specimen other  than nasopharyngeal swab, presence of viral mutation(s) within the  areas targeted by this assay, and inadequate number of viral copies  (<250 copies / mL). A negative result must be combined with clinical  observations, patient history, and epidemiological information. If result is POSITIVE SARS-CoV-2 target nucleic acids are DETECTED. The SARS-CoV-2 RNA is generally detectable in upper and lower  respiratory specimens dur ing the acute phase of infection.  Positive  results are indicative of active infection with SARS-CoV-2.  Clinical  correlation with patient history and other  diagnostic information is  necessary to determine patient infection status.  Positive results do  not rule out bacterial infection or co-infection with other viruses. If result is PRESUMPTIVE POSTIVE SARS-CoV-2 nucleic acids MAY BE PRESENT.   A presumptive positive result was obtained on the submitted specimen  and confirmed on repeat testing.  While 2019 novel coronavirus  (SARS-CoV-2) nucleic acids may be present in the submitted sample  additional confirmatory testing may be necessary for epidemiological  and / or clinical management purposes  to differentiate between  SARS-CoV-2 and other Sarbecovirus currently known to infect humans.  If clinically indicated additional testing with an alternate test  methodology [phone] is advised. The SARS-CoV-2 RNA is generally  detectable in upper and lower respiratory sp ecimens during the acute  phase of infection. The expected result is Negative. Fact Sheet for Patients:  BoilerBrush.com.cy Fact Sheet for Healthcare Providers: https://pope.com/ This test is not yet approved or cleared by the [place] FDA and has been authorized for detection and/or diagnosis of SARS-CoV-2 by FDA under an Emergency Use Authorization (EUA).  This EUA will remain in effect (meaning this test can be used) for the duration of the COVID-19 declaration under Section 564(b)(1) of the Act, 21 U.S.C. section 360bbb-3(b)(1), unless the authorization is terminated or revoked sooner. Performed at [facility], [address], [place], [place] [address]   Urine culture     Status: None   Collection Time: 12/31/2018  8:48 AM   Specimen: In/Out Cath Urine  Result Value Ref Range Status   Specimen Description  Final    IN/OUT CATH URINE Performed at [facility], [address] [place], [place] [address]    Special Requests   Final    NONE Performed at [facility], [address] [place], [place] [address]    Culture   Final    NO GROWTH Performed at [facility], [address] [place], [place] [address]    Report Status 12/21/2018 FINAL  Final  Blood Culture (routine x 2)     Status: None (Preliminary result)   Collection Time: 12/09/2018  8:57 AM   Specimen: BLOOD  Result Value Ref Range Status   Specimen Description BLOOD L ARM  Final   Special Requests   Final    BOTTLES DRAWN AEROBIC AND ANAEROBIC Blood Culture adequate volume   Culture   Final    NO GROWTH 3 DAYS Performed at [facility], [address] [place], [place] [address]    Report Status PENDING  Incomplete  Blood Culture (routine x 2)     Status: None (Preliminary result)   Collection Time: 12/11/2018  8:58 AM   Specimen: BLOOD  Result Value Ref Range Status   Specimen Description BLOOD RAC  Final   Special Requests   Final    BOTTLES DRAWN AEROBIC AND ANAEROBIC Blood Culture adequate volume   Culture   Final    NO GROWTH 3 DAYS Performed at [facility], [address] [place], [place] [address]    Report Status PENDING  Incomplete  MRSA PCR Screening     Status: None   Collection Time: 12/23/18  9:45 AM   Specimen: Nasal Mucosa; Nasopharyngeal  Result Value Ref Range Status   MRSA by PCR NEGATIVE NEGATIVE Final    Comment:        The GeneXpert MRSA Assay (FDA approved for NASAL specimens only), is one component of a comprehensive MRSA colonization surveillance program. It is not intended to diagnose MRSA infection nor to guide or monitor treatment for MRSA infections. Performed at [facility], [address] [place], [place] [address]           IMAGING    Dg Chest Port 1 View  Result Date: 06-Jan-2019 CLINICAL DATA:  Cough EXAM: PORTABLE CHEST 1 VIEW COMPARISON:  12/05/2018 FINDINGS: Heart is normal size. Diffuse bilateral airspace disease. Possible small bilateral effusions. No acute bony abnormality. Old healed left rib  fractures. IMPRESSION: Diffuse bilateral airspace disease with normal heart size. Findings concerning for pneumonia. Possible small bilateral effusions. Electronically Signed   By: Charlett NoseKevin  Dover M.D.   On: 006-Oct-2020 22:56   Dg C-arm 1-60 Min-no Report  Result Date: 06-Jan-2019 Fluoroscopy was utilized by the requesting physician.  No radiographic interpretation.   Indwelling Urinary Catheter continued, requirement due to   Reason to continue Indwelling Urinary Catheter strict Intake/Output monitoring for hemodynamic instability      ASSESSMENT AND PLAN SYNOPSIS 85 yo male with altered mental status with acute hypoxic respiratory failure due to pulmonary edema and possible pneumonia. AMS could be from possible sepsis.  Acute Hypoxic Respiratory Failure - supplemental O2 for dyspnea and/or hypoxia - continue Bronchodilator Therapy prn  Pulmonary edema -oxygen as needed -Lasix as tolerated - Echocardiogram pending -follow up cardiology recs  Altered Mental Status from baseline dementia - frequent reorientation - treat possible sepsis with IV Zosyn    SHOCK-SEPSIS -use vasopressors to keep MAP>65 -follow ABG -follow up cultures -IV Zosyn  CARDIAC ICU monitoring  ID -continue IV abx as prescibed -follow up cultures  GI GI PROPHYLAXIS as indicated  ENDO - will use ICU hypoglycemic\Hyperglycemia protocol if needed  ELECTROLYTES -follow labs as needed -replace as needed -pharmacy consultation and following  DVT/GI PRX ordered TRANSFUSIONS AS NEEDED MONITOR FSBS ASSESS the need for LABS    Critical Care Time devoted to patient care services described in this note is 35 minutes.   Nira Conn, PA-S

## 2018-12-23 NOTE — Consult Note (Addendum)
Name: Zachary Holland MRN: [id number] DOB: 01/23/1933    ADMISSION DATE:  12/24/2018 CONSULTATION DATE: 12/08/2018  REFERRING MD : Dr. Jannifer Franklin   CHIEF COMPLAINT: Shortness of Breath   BRIEF PATIENT DESCRIPTION:  85 yo male admitted to the medsurg unit 09/19 with acute encephalopathy, elevated liver enzymes secondary to choledocholithiasis s/p sphincterotomy and stone extraction via ERCP, and acute hypoxic respiratory failure secondary to pneumonia transferred to the stepdown unit 09/22 with worsening acute hypoxic respiratory failure secondary to pneumonia and pulmonary edema requiring Bipap   SIGNIFICANT EVENTS/STUDIES:  09/19-Pt admitted to the medsurg unit with pneumonia  09/19-US Abd Limited revealed no acute findings, increased liver parenchymal echogenicity consistent with hepatic steatosis, chronic common bile duct dilation, and status post cholecystectomy. 09/20-MRI Abd revealed markedly limited evaluation due to severe motion degradation. Study was also ordered prematurely due to patient discomfort. Status post cholecystectomy. Common duct measures 9 mm, within the upper limits of normal. However, there is a possible 6 mm distal CBD stone at the ampulla, poorly visualized/equivocal. Consider ERCP as clinically warranted. Mild left hydronephrosis with prominent extrarenal pelvis. Fullness of the right renal collecting system. This appearance may be related to bladder distension. Gastroenterology consulted recommended ERCP  09/21-ERCP revealed the major papilla was located entirely within a diverticulum, choledocholithiasis was found, complete removal was accomplished by biliary sphincterotomy and balloon extraction, a biliary sphincterotomy was performed, and the biliary tree was swept and nothing was found. 09/22-Pt with worsening acute hypoxic respiratory failure CXR worsening pneumonia and pulmonary edema requiring Bipap   HISTORY OF PRESENT ILLNESS:   This is an 85 yo male with a  PMH of Mild Dementia.  He presented to [facility] ER on 09/19 from home via EMS with altered mental status.  Per ER notes pts wife reported despite dementia hx pt is not confused at baseline and able to communicate.  Upon arrival to the ER pt alert, confused/mumbling, febrile temp 102.2 F, and tachycardic hr 105.  EKG revealed sinus tachycardia, hr 105 bpm, and nonspecific ST changes without ST elevation.  Lab results revealed alk phos 281, AST 514, ALT 315, total bilirubin 3.5, troponin 19, lactic acid 2.0, platelets 90, and UA negative.  COVID-19 negative, however CXR concerning for RUL pneumonia.  Pt received broad spectrum iv abx.  He was subsequently admitted to the [facility] unit per hospitalist team for additional workup and treatment.  Due to worsening liver enzymes MRI Abd ordered on 09/20 revealing possible 6 mm distal CBD stone at the ampulla recommended ERCP for further evaluation GI consulted. On 09/21 ERCP performed revealing major papilla entirely within a diverticulum, choledocholithiasis was found, complete removal was accomplished by biliary sphincterotomy and balloon extraction, a biliary sphincterotomy was performed, and the biliary tree was swept and nothing was found. On 09/22 pt developed worsening agitation/delirium and respiratory distress.  CXR concerning for worsening pneumonia and pulmonary edema, despite iv lasix respiratory failure unchanged.  Pt transferred to the stepdown unit for possible Bipap.  Detailed hospital course listed above under significant events.    PAST MEDICAL HISTORY :   has a past medical history of Dementia ([place]).  has a past surgical history that includes Cholecystectomy. Prior to Admission medications   Medication Sig Start Date End Date Taking? Authorizing Provider  cholecalciferol (VITAMIN D3) 25 MCG (1000 UT) tablet Take 1,000 Units by mouth daily.   Yes [provider]  finasteride (PROSCAR) 5 MG tablet Take 5 mg by mouth daily.   Yes [provider]  montelukast (SINGULAIR) 10 MG tablet Take 10 mg by mouth daily.   Yes [provider]  oxyCODONE (OXY IR/ROXICODONE) 5 MG immediate release tablet Take 5 mg by mouth 2 (two) times daily as needed for moderate pain or severe pain.   Yes [provider]  pregabalin (LYRICA) 100 MG capsule Take 100 mg by mouth 2 (two) times daily.   Yes [provider]  vitamin B-12 (CYANOCOBALAMIN) 1000 MCG tablet Take 1,000 mcg by mouth daily.   Yes [provider]   Allergies  Allergen Reactions   Tramadol Other (See Comments)    Altered mental status     FAMILY HISTORY:  family history is not on file. SOCIAL HISTORY:  reports that he has never smoked. He has never used smokeless tobacco.  REVIEW OF SYSTEMS:   Unable to assess pt confused and agitated   SUBJECTIVE:  Unable to assess pt confused and agitated   VITAL SIGNS: Temp:  [97.8 F (36.6 C)-98.6 F (37 C)] 97.9 F (36.6 C) (09/22 0108) Pulse Rate:  [64-134] 124 (09/22 0126) Resp:  [10-36] 36 (09/22 0126) BP: (91-136)/(64-118) 136/103 (09/22 0126) SpO2:  [89 %-100 %] 90 % (09/22 0126) Weight:  [63.5 kg] 63.5 kg (09/21 1111)  PHYSICAL EXAMINATION: General: acutely ill appearing elderly male, NAD  Neuro: confused, agitated, PERRLA, not following commands, moving all extremities  HEENT: supple, no JVD  Cardiovascular: sinus tach with frequent PVC's, no R/G  Lungs: diffuse crackles throughout, expiratory wheezes LUL, slightly tachypneic  Abdomen: +BS x4, soft, non distended  Musculoskeletal: normal bulk and tone, no edema  Skin: left elbow abrasion   Recent Labs  Lab 12/18/2018 0832 12/07/2018 1359 12/21/18 0445 12/05/2018 0855  NA 142  --  142 142  K 3.6  --  4.2 3.9  CL 105  --  112* 109  CO2 29  --  21* 24  BUN 21  --  28* 21  CREATININE 1.17 0.95 1.15 1.05  GLUCOSE 88  --  70 118*   Recent Labs  Lab 12/11/2018 0832 12/21/18 0445 12/17/2018 0855  HGB 14.7 12.8* 13.6  HCT  45.0 39.7 41.7  WBC 5.7 8.5 7.5  PLT 90* 84* 97*   Mr Abdomen Wo Contrast  Result Date: 12/21/2018 CLINICAL DATA:  Abnormal LFTs, nausea/vomiting, confusion EXAM: MRI ABDOMEN WITHOUT CONTRAST TECHNIQUE: Multiplanar multisequence MR imaging was performed without the administration of intravenous contrast. COMPARISON:  Right upper quadrant ultrasound dated 12/17/2018 FINDINGS: Severely motion degraded study. Study was also aborted prematurely due to patient discomfort. Lower chest: Lung bases are clear. Hepatobiliary: Small hepatic cysts measuring up to 13 mm in the right hepatic lobe (series 4/image 15). Status post cholecystectomy. No intrahepatic ductal dilatation. Common duct measures 9 mm, within the upper limits of normal for postsurgical appearance. However, there is a possible 6 mm distal CBD stone at the ampulla (series 6/image 7), although poorly visualized/equivocal. Pancreas:  Grossly unremarkable. Spleen:  Within normal limits. Adrenals/Urinary Tract:  Adrenal glands are within normal limits. Kidneys are grossly unremarkable. Fullness of the right renal collecting system. Mild left hydronephrosis with prominent extrarenal pelvis (series 6/image 8). This appearance may be related to bladder distention. Stomach/Bowel: Stomach is within normal limits. Visualized bowel is grossly unremarkable. Vascular/Lymphatic:  No evidence of abdominal aortic aneurysm. No suspicious abdominal lymphadenopathy. Other:  Trace perihepatic ascites. Musculoskeletal: No focal osseous lesions. IMPRESSION: Markedly limited evaluation due to severe motion degradation. Study was also ordered prematurely due to patient discomfort. Status  post cholecystectomy. Common duct measures 9 mm, within the upper limits of normal. However, there is a possible 6 mm distal CBD stone at the ampulla, poorly visualized/equivocal. Consider ERCP as clinically warranted. Mild left hydronephrosis with prominent extrarenal pelvis. Fullness of the  right renal collecting system. This appearance may be related to bladder distension. Electronically Signed   By: Julian Hy M.D.   On: 12/21/2018 10:22   Dg Chest Port 1 View  Result Date: 12/08/2018 CLINICAL DATA:  Cough EXAM: PORTABLE CHEST 1 VIEW COMPARISON:  12/06/2018 FINDINGS: Heart is normal size. Diffuse bilateral airspace disease. Possible small bilateral effusions. No acute bony abnormality. Old healed left rib fractures. IMPRESSION: Diffuse bilateral airspace disease with normal heart size. Findings concerning for pneumonia. Possible small bilateral effusions. Electronically Signed   By: Rolm Baptise M.D.   On: 12/12/2018 22:56   Dg C-arm 1-60 Min-no Report  Result Date: 12/07/2018 Fluoroscopy was utilized by the requesting physician.  No radiographic interpretation.    ASSESSMENT / PLAN:  Acute hypoxic respiratory failure secondary to pneumonia and pulmonary edema Supplemental O2 for dyspnea and/or hypoxia  40 mg iv lasix x1 dose Prn bronchodilator therapy  Trend WBC and monitor fever curve  Continue vancomycin and meropenum for now   Choledocholithiasis s/p sphincterotomy and stone extraction via ERCP 09/21 Trend liver enzymes   Agitation/Delirium secondary urinary retention  Frequent reorientation  Strict intake/output  Foley catheter inserted   Marda Stalker, [place] Pager [phone] (please enter 7 digits) PCCM Consult Pager [phone] (please enter 7 digits)

## 2018-12-23 NOTE — Progress Notes (Signed)
Patient continues to be SOB, restless and tachypnea and tachycardic; Dr. Jannifer Franklin ordered transfer to ICU for continuous Bipap; Jachelle Fluty K, RN1:33 AM 12/23/2018

## 2018-12-23 NOTE — Progress Notes (Signed)
Pharmacy Electrolyte Monitoring Consult:  Pharmacy consulted to assist in monitoring and replacing electrolytes in this 85 y.o. male admitted on 12-23-18 with sepsis; pneumonia vs. GI source. Patient with past medical history significant for dementia and cholecystectomy.  Labs:  Sodium (mmol/L)  Date Value  12/23/2018 145   Potassium (mmol/L)  Date Value  12/23/2018 3.5   Magnesium (mg/dL)  Date Value  12/23/2018 1.9   Phosphorus (mg/dL)  Date Value  12/23/2018 2.3 (L)   Calcium (mg/dL)  Date Value  12/23/2018 8.0 (L)   Albumin (g/dL)  Date Value  12/23/2018 3.2 (L)    Assessment/Plan: Will order potassium 24mEq IV Q1hr x 3 doses and magnesium 1g IV x 1.   Will replace for goal potassium ~4 and and goal magnesium ~ 2 while in ICU. Patient unable to take oral medications at this time.   BMP/Magnesium/Phosphorus with am labs.   Pharmacy will continue to monitor adjust per consult.   Jillienne Egner L 12/23/2018 2:23 PM

## 2018-12-23 NOTE — Progress Notes (Signed)
PT Cancellation Note  Patient Details Name: Zachary Holland MRN: [id number] DOB: 02-Feb-1933   Cancelled Treatment:    Reason Eval/Treat Not Completed: Medical issues which prohibited therapy(Consult received and chart reviewed. Patient noted with transfer to CCU due to decline in respiratory status.  Per guidelines, will require new orders to resume care after transfer to higher level of care.  Please reconsult as medically appropriate.)   Phyillis Dascoli H. Owens Shark, PT, DPT, NCS 12/23/18, 12:38 PM [phone]

## 2018-12-23 NOTE — Progress Notes (Signed)
[name] [facility] [place] NAME: Zachary Holland    MR#:  [id number]  DATE OF BIRTH:  1932-10-03  SUBJECTIVE:  patient has dementia. Poor historian. Status post ERCP. Patient developed significant respiratory distress became hypoxic was transferred to the ICU. Developed bilateral pneumonia. Currently on press attacks drip with sitter in the room. Agitated restless trying to get out of bed. REVIEW OF SYSTEMS:   Review of Systems  Unable to perform ROS: Dementia   Tolerating Diet:npo Tolerating PT: pending  DRUG ALLERGIES:   Allergies  Allergen Reactions  . Tramadol Other (See Comments)    Altered mental status     VITALS:  Blood pressure 93/71, pulse (!) 51, temperature 98.1 F (36.7 C), temperature source Axillary, resp. rate 13, height 5\' 10"  (1.778 m), weight 63.5 kg, SpO2 95 %.  PHYSICAL EXAMINATION:   Physical Exam  GENERAL:  85 y.o.-year-old patient lying in the bed with no acute distress. Thin, cachectic+ EYES: Pupils equal, round, reactive to light and accommodation. No scleral icterus.  HEENT: Head atraumatic, normocephalic. Oropharynx and nasopharynx clear.  NECK:  Supple, no jugular venous distention. No thyroid enlargement, no tenderness.  LUNGS:distant  breath sounds bilaterally, no wheezing, rales, rhonchi. No use of accessory muscles of respiration.  CARDIOVASCULAR: S1, S2 normal. No murmurs, rubs, or gallops.  ABDOMEN: Soft, nontender, nondistended. Bowel sounds present. No organomegaly or mass.  EXTREMITIES: No cyanosis, clubbing or edema b/l.    NEUROLOGIC:unable to assess but grossly non focal PSYCHIATRIC:  patient is l very restless, agitated SKIN: No obvious rash, lesion, or ulcer.--per RN documentation  LABORATORY PANEL:  CBC Recent Labs  Lab 12/23/18 0409  WBC 8.3  HGB 14.3  HCT 43.1  PLT 110*    Chemistries  Recent Labs  Lab 12/23/18 0409  NA 145  K 3.5  CL 113*  CO2 21*  GLUCOSE 130*  BUN  25*  CREATININE 1.13  CALCIUM 8.0*  MG 1.9  AST 68*  ALT 79*  ALKPHOS 250*  BILITOT 7.6*   Cardiac Enzymes No results for input(s): TROPONINI in the last 168 hours. RADIOLOGY:  Dg Chest Port 1 View  Result Date: 12/13/2018 CLINICAL DATA:  Cough EXAM: PORTABLE CHEST 1 VIEW COMPARISON:  01-Jan-2019 FINDINGS: Heart is normal size. Diffuse bilateral airspace disease. Possible small bilateral effusions. No acute bony abnormality. Old healed left rib fractures. IMPRESSION: Diffuse bilateral airspace disease with normal heart size. Findings concerning for pneumonia. Possible small bilateral effusions. Electronically Signed   By: Rolm Baptise M.D.   On: 12/21/2018 22:56   Dg C-arm 1-60 Min-no Report  Result Date: 12/06/2018 Fluoroscopy was utilized by the requesting physician.  No radiographic interpretation.   ASSESSMENT AND PLAN:  Zachary Holland is a 85 y.o. male with history of mild dementia, status post cholecystectomy in 05/2018 who is admitted yesterday secondary to altered mental status compared to his baseline according to patient's wife.   1. Acute hypoxic respiratory failure secondary to multifocal pneumonia with aspiration -patient transferred to the ICU currently on oxygen nebulizer and broad-spectrum antibiotics -speech therapy to see for swallow eval  2. Choledocholithiasis with possible ascending cholangitis -IV fluids,npo  IV zosyn -G.I. consultation with Dr. Marius Ditch appreciated. -Elevated LFTs with direct bilirubin--obstructive etio--/Stone CBD -MRCP done today shows possible stone in the common bile duct -s/p ERCP with stone removal from CBD -cardiology consultation appreciated with Dr. Ubaldo Glassing-- pt does not have cardiac history (per wife) and ok for GI procedure  2. Mild elevated troponin in the setting of right upper quadrant pain -no cardiac history. -Dr. Lady Gary input noted  3.Dementia at baseline -currently on IV precedex gtt -if patient continues to worsen consider  palliative care consultation.  4. Malnutrition with h/o dementia -Dietitian to see  Above was discussed with patient's wife she is agreeable with the plan.  Case discussed with Care Management/Social Worker. Management plans discussed with the patient, family and they are in agreement.  CODE STATUS: DNR  DVT Prophylaxis: heparin on hold for GI procedure  TOTAL TIME TAKING CARE OF THIS PATIENT: *30* minutes.  >50% time spent on counselling and coordination of care  POSSIBLE D/C IN * few DAYS, DEPENDING ON CLINICAL CONDITION.  Note: This dictation was prepared with Dragon dictation along with smaller phrase technology. Any transcriptional errors that result from this process are unintentional.  Enedina Finner M.D on 12/23/2018 at 3:05 PM  Between 7am to 6pm - Pager - [phone]  After 6pm go to [email or web address] - Social research officer, government  [facility]  Office  [phone]  CC: Primary care physician; Administration, VeteransPatient ID: Zachary Holland, male   DOB: 1932/07/11, 85 y.o.   MRN: [id number]

## 2018-12-23 NOTE — Progress Notes (Signed)
*  PRELIMINARY RESULTS* Echocardiogram 2D Echocardiogram has been performed.  Zachary Holland 12/23/2018, 10:56 AM

## 2018-12-23 NOTE — Progress Notes (Signed)
Transferred to ICU #18 into Josh, RN's care; Report given and rec'd; Barbaraann Faster, RN 2:10 AM 12/23/2018

## 2018-12-24 DIAGNOSIS — E43 Unspecified severe protein-calorie malnutrition: Secondary | ICD-10-CM | POA: Insufficient documentation

## 2018-12-24 LAB — HEPATIC FUNCTION PANEL
ALT: 53 U/L — ABNORMAL HIGH (ref 0–44)
AST: 44 U/L — ABNORMAL HIGH (ref 15–41)
Albumin: 2.7 g/dL — ABNORMAL LOW (ref 3.5–5.0)
Alkaline Phosphatase: 177 U/L — ABNORMAL HIGH (ref 38–126)
Bilirubin, Direct: 2.5 mg/dL — ABNORMAL HIGH (ref 0.0–0.2)
Indirect Bilirubin: 1.9 mg/dL — ABNORMAL HIGH (ref 0.3–0.9)
Total Bilirubin: 4.4 mg/dL — ABNORMAL HIGH (ref 0.3–1.2)
Total Protein: 5.3 g/dL — ABNORMAL LOW (ref 6.5–8.1)

## 2018-12-24 LAB — BASIC METABOLIC PANEL
Anion gap: 9 (ref 5–15)
BUN: 36 mg/dL — ABNORMAL HIGH (ref 8–23)
CO2: 23 mmol/L (ref 22–32)
Calcium: 8 mg/dL — ABNORMAL LOW (ref 8.9–10.3)
Chloride: 114 mmol/L — ABNORMAL HIGH (ref 98–111)
Creatinine, Ser: 1.25 mg/dL — ABNORMAL HIGH (ref 0.61–1.24)
GFR calc Af Amer: >60 mL/min (ref >60)
GFR calc non Af Amer: 53 mL/min — ABNORMAL LOW (ref >60)
Glucose, Bld: 129 mg/dL — ABNORMAL HIGH (ref 70–99)
Potassium: 4.2 mmol/L (ref 3.5–5.1)
Sodium: 146 mmol/L — ABNORMAL HIGH (ref 135–145)

## 2018-12-24 LAB — PROCALCITONIN: Procalcitonin: 4.26 ng/mL

## 2018-12-24 LAB — PHOSPHORUS: Phosphorus: 2.7 mg/dL (ref 2.5–4.6)

## 2018-12-24 LAB — MAGNESIUM: Magnesium: 2.4 mg/dL (ref 1.7–2.4)

## 2018-12-24 MED ORDER — BOOST / RESOURCE BREEZE PO LIQD CUSTOM
1.0000 | Freq: Three times a day (TID) | ORAL | Status: DC
  Administered 2018-12-24 – 2018-12-25 (×4): 1 via ORAL

## 2018-12-24 NOTE — Progress Notes (Addendum)
PCCM ATTENDING ATTESTATION:  I have evaluated patient with the APP, I personally  reviewed database in its entirety and discussed care plan in detail. In addition, this patient was discussed on multidisciplinary rounds.   Important exam findings: Mentation improved  Reg, no Murmurs NT.ND, soft +BS No edema No focal deficits    Major problems addressed by PCCM team: Acute respiratory failure due to CHF with pneumonia    PLAN/REC: Continue antibioitics  Agree with careplan as outlined below in PA-S note Continue to monitor in ICU/SDU Need to address goals of care   Critical Care Time devoted to patient care services described in this note is 32 minutes.   Overall, patient is critically ill, prognosis is guarded.  Patient with Multiorgan failure and at high risk for cardiac arrest and death.     Vida Rigger, M.D.  Pulmonary & Critical Care Medicine  [facility] [place]       Name: Zachary Holland MRN: [id number] DOB: [birth year]     CONSULTATION DATE: 12/24/2018  CHIEF COMPLAINT:  Altered mental status  SIGNIFICANT EVENTS/STUDIES: 09/19-Pt admitted to the medsurg unit with pneumonia  09/19-US Abd Limited revealed no acute findings, increased liver parenchymal echogenicity consistent with hepaticsteatosis, chronic common bile duct dilation, and status post cholecystectomy. 09/20-MRI Abd revealed markedly limited evaluation due to severe motion degradation. Study was also ordered prematurely due to patient discomfort. Status post cholecystectomy. Common duct measures 9 mm, within the upper limits of normal. However, there is a possible 6 mm distal CBD stone at the ampulla, poorly visualized/equivocal. Consider ERCP as clinically warranted. Mild left hydronephrosis with prominent extrarenal pelvis. Fullness of the right renal collecting system. This appearance may be related to bladder distension.Gastroenterologyconsultedrecommended ERCP 09/21-ERCP revealed the  major papilla was located entirely within a diverticulum, choledocholithiasis was found, complete removal was accomplished by biliary sphincterotomy and balloon extraction, abiliary sphincterotomy was performed, and the biliary tree was swept and nothing was found. 09/22-Pt with worsening acute hypoxic respiratory failure CXR worsening pneumonia and pulmonary edema requiring Bipap  HISTORY OF PRESENT ILLNESS:  Zachary Holland is an 85 yo male with PMH of mild dementia. He presented to the ER on 9/19 for AMS and was found to have possible pneumonia on CXR an elevated LFTs. He was transferred to [facility] and an MRI revealed distal CBD stone. ERCP revealed choledocholithiasis, stone was removed and biliary sphincterotomy was performed. After ERCP, pt found to have cholestatic transaminitis. On 9/22, pt became more agitated and had respiratory distress which required BiPAP and transfer to ICU. 9/21 CXR concerning for worsening pneumonia.   PAST MEDICAL HISTORY :   has a past medical history of Dementia (HCC).  has a past surgical history that includes Cholecystectomy and ERCP (N/A, 12/25/2018). Prior to Admission medications   Medication Sig Start Date End Date Taking? Authorizing Provider  cholecalciferol (VITAMIN D3) 25 MCG (1000 UT) tablet Take 1,000 Units by mouth daily.   Yes [provider]  finasteride (PROSCAR) 5 MG tablet Take 5 mg by mouth daily.   Yes [provider]  montelukast (SINGULAIR) 10 MG tablet Take 10 mg by mouth daily.   Yes [provider]  oxyCODONE (OXY IR/ROXICODONE) 5 MG immediate release tablet Take 5 mg by mouth 2 (two) times daily as needed for moderate pain or severe pain.   Yes [provider]  pregabalin (LYRICA) 100 MG capsule Take 100 mg by mouth 2 (two) times daily.   Yes [provider]  vitamin  B-12 (CYANOCOBALAMIN) 1000 MCG tablet Take 1,000 mcg by mouth daily.   Yes [provider]   Allergies  Allergen  Reactions  . Tramadol Other (See Comments)    Altered mental status     Review of Systems:  Gen: Endorses feeling uncomfortable, but denies any pain. Denies  fever, sweats, chills weight loss  HEENT: Denies blurred vision, double vision, ear pain, eye pain, hearing loss, nose bleeds, sore throat Cardiac:  No dizziness, chest pain or heaviness, chest tightness,edema, No JVD Resp:   Endorses feeling SOB. No cough, -sputum production,-wheezing, -hemoptysis,  Gi: Denies swallowing difficulty, stomach pain, nausea or vomiting, diarrhea, constipation, bowel incontinence Gu:  Denies bladder incontinence, burning urine Ext:   Denies Joint pain, stiffness or swelling Skin: Denies  skin rash, easy bruising or bleeding or hives Endoc:  Denies polyuria, polydipsia , polyphagia or weight change Psych:   Denies depression, insomnia or hallucinations  Other:  All other systems negative   VITAL SIGNS: Temp:  [97.5 F (36.4 C)-98.2 F (36.8 C)] 97.7 F (36.5 C) (09/23 0900) Pulse Rate:  [40-72] 58 (09/23 0900) Resp:  [10-37] 26 (09/23 0900) BP: (80-111)/(51-78) 101/73 (09/23 0900) SpO2:  [93 %-99 %] 98 % (09/23 0900)   I/O last 3 completed shifts: In: 830.2 [I.V.:165.4; IV Piggyback:664.8] Out: 2460 [Urine:2460] Total I/O In: 490 [P.O.:340; Other:150] Out: -    SpO2: 98 % O2 Flow Rate (L/min): 2 L/min   Physical Examination:  GENERAL: in no acute distress HEAD: Normocephalic, atraumatic.  EYES: Pupils equal, round, reactive to light.  No scleral icterus.  MOUTH: Moist mucosal membrane. NECK: Supple. No JVD.  PULMONARY: + crackles worse on left side, much improved from yesterday CARDIOVASCULAR: S1 and S2. Regular rate and rhythm. No murmurs, rubs, or gallops.  GASTROINTESTINAL: Soft, nontender, -distended. No masses. Positive bowel sounds. No hepatosplenomegaly.  MUSCULOSKELETAL: No swelling, clubbing, or edema.  NEUROLOGIC: A&Ox4, responds appropriately SKIN:intact,warm,dry  I  personally reviewed lab work that was obtained in last 24 hrs. CXR Independently reviewed  MEDICATIONS: I have reviewed all medications and confirmed regimen as documented   CULTURE RESULTS   Recent Results (from the past 240 hour(s))  SARS Coronavirus 2 [facility](Hospital order, [facility] Nasopharyngeal Nasopharyngeal Swab     Status: None   Collection Time: 12/18/2018  8:33 AM   Specimen: Nasopharyngeal Swab  Result Value Ref Range Status   SARS Coronavirus 2 NEGATIVE NEGATIVE Final    Comment: (NOTE) If result is NEGATIVE SARS-CoV-2 target nucleic acids are NOT DETECTED. The SARS-CoV-2 RNA is generally detectable in upper and lower  respiratory specimens during the acute phase of infection. The lowest  concentration of SARS-CoV-2 viral copies this assay can detect is 250  copies / mL. A negative result does not preclude SARS-CoV-2 infection  and should not be used as the sole basis for treatment or other  patient management decisions.  A negative result may occur with  improper specimen collection / handling, submission of specimen other  than nasopharyngeal swab, presence of viral mutation(s) within the  areas targeted by this assay, and inadequate number of viral copies  (<250 copies / mL). A negative result must be combined with clinical  observations, patient history, and epidemiological information. If result is POSITIVE SARS-CoV-2 target nucleic acids are DETECTED. The SARS-CoV-2 RNA is generally detectable in upper and lower  respiratory specimens dur ing the acute phase of infection.  Positive  results are indicative of active infection with SARS-CoV-2.  Clinical  correlation with patient history and other diagnostic information is  necessary to determine patient infection status.  Positive results do  not rule out bacterial infection or co-infection with other viruses. If result is PRESUMPTIVE POSTIVE SARS-CoV-2 nucleic acids MAY BE PRESENT.   A  presumptive positive result was obtained on the submitted specimen  and confirmed on repeat testing.  While 2019 novel coronavirus  (SARS-CoV-2) nucleic acids may be present in the submitted sample  additional confirmatory testing may be necessary for epidemiological  and / or clinical management purposes  to differentiate between  SARS-CoV-2 and other Sarbecovirus currently known to infect humans.  If clinically indicated additional testing with an alternate test  methodology [phone]) is advised. The SARS-CoV-2 RNA is generally  detectable in upper and lower respiratory sp ecimens during the acute  phase of infection. The expected result is Negative. Fact Sheet for Patients:  BoilerBrush.com.cy Fact Sheet for Healthcare Providers: https://pope.com/ This test is not yet approved or cleared by the [place] FDA and has been authorized for detection and/or diagnosis of SARS-CoV-2 by FDA under an Emergency Use Authorization (EUA).  This EUA will remain in effect (meaning this test can be used) for the duration of the COVID-19 declaration under Section 564(b)(1) of the Act, 21 U.S.C. section 360bbb-3(b)(1), unless the authorization is terminated or revoked sooner. Performed at [facility], [address] [place], [place] [address]   Urine culture     Status: None   Collection Time: 12/08/2018  8:48 AM   Specimen: In/Out Cath Urine  Result Value Ref Range Status   Specimen Description   Final    IN/OUT CATH URINE Performed at [facility], [address], [place], [place] [address]    Special Requests   Final    NONE Performed at [facility], [address] [place], [place] [address]    Culture   Final    NO GROWTH Performed at [facility], [address], [place], [place] [address]    Report Status 12/21/2018 FINAL  Final  Blood Culture (routine x 2)     Status: None (Preliminary result)    Collection Time: 12/11/2018  8:57 AM   Specimen: BLOOD  Result Value Ref Range Status   Specimen Description BLOOD L ARM  Final   Special Requests   Final    BOTTLES DRAWN AEROBIC AND ANAEROBIC Blood Culture adequate volume   Culture   Final    NO GROWTH 4 DAYS Performed at [facility], [address], [place], [place] [address]    Report Status PENDING  Incomplete  Blood Culture (routine x 2)     Status: None (Preliminary result)   Collection Time: 12/16/2018  8:58 AM   Specimen: BLOOD  Result Value Ref Range Status   Specimen Description BLOOD RAC  Final   Special Requests   Final    BOTTLES DRAWN AEROBIC AND ANAEROBIC Blood Culture adequate volume   Culture   Final    NO GROWTH 4 DAYS Performed at [facility], [address] [place], [place] [address]    Report Status PENDING  Incomplete  MRSA PCR Screening     Status: None   Collection Time: 12/23/18  9:45 AM   Specimen: Nasal Mucosa; Nasopharyngeal  Result Value Ref Range Status   MRSA by PCR NEGATIVE NEGATIVE Final    Comment:        The GeneXpert MRSA Assay (FDA approved for NASAL specimens  only), is one component of a comprehensive MRSA colonization surveillance program. It is not intended to diagnose MRSA infection nor to guide or monitor treatment for MRSA infections. Performed at [facility], [address], [place],  [place] [address]     Indwelling Urinary Catheter continued, requirement due to   Reason to continue Indwelling Urinary Catheter strict Intake/Output monitoring for hemodynamic instability      ASSESSMENT AND PLAN SYNOPSIS 85 yo male with history of mild dementia s/p ERCP removal of stone and sphincterotomy with altered mental status with acute hypoxic respiratory failure due to CHF causing  pulmonary edema and possible pneumonia. AMS could be from possible sepsis. Pt clinically improving today with mental status approaching baseline.  Acute Hypoxic  Respiratory Failure - supplemental O2 for dyspnea and/or hypoxia - continue Bronchodilator Therapy prn  ACUTE SYSTOLIC CARDIAC FAILURE- EF: 20-25% -oxygen as needed -Lasix as tolerated -follow up cardiac enzymes as indicated -follow up cardiology recs  Altered Mental Status from baseline dementia - frequent reorientation - treat possible pneumosepsis with IV Zosyn   Shock - possible pneumosepsis -use vasopressors to keep MAP>65 -follow ABG -follow up cultures -IV Zosyn  Transaminitis - resolving - continue to check hepatic function panel  CARDIAC ICU monitoring  ID -continue IV abx as prescibed -follow up cultures  GI GI PROPHYLAXIS as indicated  ENDO - will use ICU hypoglycemic\Hyperglycemia protocol if needed  ELECTROLYTES -follow labs as needed -replace as needed -pharmacy consultation and following  DVT/GI PRX ordered TRANSFUSIONS AS NEEDED MONITOR FSBS ASSESS the need for LABS    Critical Care Time devoted to patient care services described in this note is 45 minutes.   Overall, patient is critically ill, prognosis is guarded.  Patient with Multiorgan failure and at high risk for cardiac arrest and death.    Nira Conn, PA-S

## 2018-12-24 NOTE — Progress Notes (Signed)
Pharmacy Electrolyte Monitoring Consult:  Pharmacy consulted to assist in monitoring and replacing electrolytes in this 85 y.o. male admitted on 12/30/2018 with sepsis; pneumonia vs. GI source. Patient with past medical history significant for dementia and cholecystectomy.  Labs:  Sodium (mmol/L)  Date Value  12/24/2018 146 (H)   Potassium (mmol/L)  Date Value  12/24/2018 4.2   Magnesium (mg/dL)  Date Value  12/24/2018 2.4   Phosphorus (mg/dL)  Date Value  12/24/2018 2.7   Calcium (mg/dL)  Date Value  12/24/2018 8.0 (L)   Albumin (g/dL)  Date Value  12/23/2018 3.2 (L)  Corrected calcium: 8.6  Assessment/Plan:  Will replace for goal potassium ~4 and and goal magnesium ~ 2 while in ICU. Patient now on a clear liquid diet with aspiration precautions.   No electrolyte supplementation indicated today.  BMP/Magnesium/Phosphorus with am labs.   Pharmacy will continue to monitor adjust per consult.   [place] Resident 12/24/2018 12:02 PM

## 2018-12-24 NOTE — Progress Notes (Signed)
Nutrition Follow-up  DOCUMENTATION CODES:   Severe malnutrition in context of social or environmental circumstances  INTERVENTION:  Provide Boost Breeze po TID, each supplement provides 250 kcal and 9 grams of protein.  Will monitor for diet advancement. Once diet advanced recommend switching oral nutrition supplement to Ensure Enlive po TID, each supplement provides 350 kcal and 20 grams of protein.  NUTRITION DIAGNOSIS:   Severe Malnutrition related to social / environmental circumstances(advanced age, inadequate oral intake) as evidenced by severe fat depletion, severe muscle depletion.  New nutrition diagnosis.  GOAL:   Patient will meet greater than or equal to 90% of their needs  Progressing.  MONITOR:   PO intake, Supplement acceptance, Diet advancement, Labs, Weight trends, Skin, I & O's  REASON FOR ASSESSMENT:   Consult Assessment of nutrition requirement/status  ASSESSMENT:   85 y.o. male with history of mild dementia, who is independent of ADLs at baseline, status post cholecystectomy in 05/2018 presented with fever, hypotension found to have obstructive jaundice secondary to choledocholithiasis, also has elevated troponin.  Met with patient at bedside. He is somewhat confused. Safety sitter was present in room. He could not recall how he had done with his breakfast tray. Per sitter he only had some of the Ginger[place]ale. Patient is amenable to trying oral nutrition supplement to help meet calorie/protein needs. Able to complete NFPE today and patient meets criteria for severe malnutrition.  Medications reviewed and include: Solu-Cortef 50 mg Q6hrs IV, Precedex gtt, famotidine, Zosyn.  Labs reviewed: Sodium 146, Chloride 114, BUN 36, Creatinine 1.25.  NUTRITION - FOCUSED PHYSICAL EXAM:    Most Recent Value  Orbital Region  Severe depletion  Upper Arm Region  Severe depletion  Thoracic and Lumbar Region  Severe depletion  Buccal Region  Severe depletion  Temple  Region  Severe depletion  Clavicle Bone Region  Severe depletion  Clavicle and Acromion Bone Region  Severe depletion  Scapular Bone Region  Severe depletion  Dorsal Hand  Severe depletion  Patellar Region  Severe depletion  Anterior Thigh Region  Severe depletion  Posterior Calf Region  Moderate depletion  Edema (RD Assessment)  None  Hair  Reviewed  Eyes  Reviewed  Mouth  Reviewed  Skin  Reviewed  Nails  Reviewed     Diet Order:   Diet Order            Diet clear liquid Room service appropriate? Yes; Fluid consistency: Thin  Diet effective now             EDUCATION NEEDS:   Not appropriate for education at this time  Skin:  Skin Assessment: Skin Integrity Issues:(MSAD to groin)  Last BM:  12/02/2018 per chart  Height:   Ht Readings from Last 1 Encounters:  12/31/2018 '5\' 10"'$  (1.778 m)   Weight:   Wt Readings from Last 1 Encounters:  12/09/2018 63.5 kg   Ideal Body Weight:  75.4 kg  BMI:  Body mass index is 20.09 kg/m.  Estimated Nutritional Needs:   Kcal:  1800-2100kcal/day  Protein:  90-105g/day  Fluid:  >1.6L/day  Willey Blade, MS, RD, LDN Office: [phone] Pager: [phone] After Hours/Weekend Pager: [phone]

## 2018-12-24 NOTE — Evaluation (Signed)
Physical Therapy Evaluation Patient Details Name: Zachary Holland MRN: [id number] DOB: 1932-07-31 Today's Date: 12/24/2018   History of Present Illness  presented to ER secondary to AMS, fever; admitted for management of sepsis related to CAP.  Hospital course additionally significant for choledocholithiasis s/p ERCP with stone removal and worsening respiratory failure requiring transfer to CCU.  Clinical Impression  Upon evaluation, patient alert and oriented to self, general location; unaware of details or events related to current hospitalization.  Endorses feeling generally confused throughout session.  Follows simple commands and participates well, but impulsive with limited insight into deficits and need for assist at times. Bilat UE/LE generally deconditioned, but functional for basic transfers and gait efforts (at least 4-/5 throughout).  Currently requiring cga/min assist for bed mobility; min/mod assist for sit/stand, standing balance and lateral stepping edge of bed.  Demonstrates decreased step height/length, poor balance requiring assist for weight shift, postural control and dynamic balance.  R ant/lateral lean with fatigue.  Requiring hand-over-hand facilitation for stepping pattern (confused to task at times). Generally hypotensive throughout (unchanged with accommodation to position or introduction of activity), but stable despite transition to upright. Would benefit from skilled PT to address above deficits and promote optimal return to PLOF; recommend transition to STR upon discharge from acute hospitalization.     Follow Up Recommendations SNF    Equipment Recommendations  Rolling walker with 5" wheels;3in1 (PT)    Recommendations for Other Services       Precautions / Restrictions Precautions Precautions: Fall Restrictions Weight Bearing Restrictions: No      Mobility  Bed Mobility Overal bed mobility: Needs Assistance Bed Mobility: Supine to Sit;Sit to Supine     Supine to sit: Min guard Sit to supine: Min guard   General bed mobility comments: slightly impulsive  Transfers Overall transfer level: Needs assistance Equipment used: 1 person hand held assist Transfers: Sit to/from Stand Sit to Stand: Min assist;Mod assist         General transfer comment: increased sway in A/P plane, min assist to prevent anterior LOB at times  Ambulation/Gait Ambulation/Gait assistance: Mod assist Gait Distance (Feet): 5 Feet Assistive device: 1 person hand held assist       General Gait Details: lateral stepping edge of bed, mod assist, for weight shift, postural control and dynamic balance.  R ant/lateral lean with fatigue.  Requiring hand-over-hand facilitation for stepping pattern  Stairs            Wheelchair Mobility    Modified Rankin (Stroke Patients Only)       Balance Overall balance assessment: Needs assistance Sitting-balance support: No upper extremity supported;Feet supported Sitting balance-Leahy Scale: Fair     Standing balance support: Single extremity supported Standing balance-Leahy Scale: Poor                               Pertinent Vitals/Pain Pain Assessment: Faces Faces Pain Scale: Hurts even more Pain Location: L hip/buttocks Pain Descriptors / Indicators: Aching;Guarding;Grimacing Pain Intervention(s): Limited activity within patient's tolerance;Monitored during session;Repositioned    Home Living Family/patient expects to be discharged to:: Private residence Living Arrangements: Spouse/significant other Available Help at Discharge: Family;Available PRN/intermittently Type of Home: House Home Access: Stairs to enter Entrance Stairs-Rails: Right;Left;Can reach both Entrance Stairs-Number of Steps: 3 Home Layout: One level Home Equipment: None      Prior Function Level of Independence: Independent         Comments: Indep  with ADLs, household and community mobilization without  assist device; denies fall history within previous 12 months. No home O2.  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     Hand Dominance        Extremity/Trunk Assessment   Upper Extremity Assessment Upper Extremity Assessment: Generalized weakness    Lower Extremity Assessment Lower Extremity Assessment: Generalized weakness(grossly 4-/5 throughout)       Communication      Cognition Arousal/Alertness: Awake/alert Behavior During Therapy: WFL for tasks assessed/performed Overall Cognitive Status: History of cognitive impairments - at baseline                                 General Comments: oriented to self, general location; unaware of details of hospitalization and reports feeling generally confused to events of illness/current stay      General Comments      Exercises Other Exercises Other Exercises: Sit/stand x3 with L HHA, min/mod assist--broad BOs, increased sway in A/P plane Other Exercises: Standing LE therex, 1x10, mod assist with L HHA: heel raises, mini squats.  Poor dynamic balance, limited activity tolerance.  Generally hypotensive throughout (unchanged with accommodation to position or introduction of activity), but stable despite transition to upright.   Assessment/Plan    PT Assessment Patient needs continued PT services  PT Problem List Decreased strength;Decreased range of motion;Decreased activity tolerance;Decreased balance;Decreased mobility;Decreased coordination;Decreased cognition;Decreased knowledge of use of DME;Decreased safety awareness;Decreased knowledge of precautions;Cardiopulmonary status limiting activity;Impaired sensation       PT Treatment Interventions DME instruction;Gait training;Stair training;Functional mobility training;Therapeutic activities;Therapeutic exercise;Balance training;Neuromuscular re-education;Cognitive remediation;Patient/family education    PT Goals (Current goals can  be found in the Care Plan section)  Acute Rehab PT Goals Patient Stated Goal: to remain active and get better PT Goal Formulation: With patient/family Time For Goal Achievement: 01/07/19 Potential to Achieve Goals: Good    Frequency Min 2X/week   Barriers to discharge Decreased caregiver support      Co-evaluation               AM-PAC PT "6 Clicks" Mobility  Outcome Measure Help needed turning from your back to your side while in a flat bed without using bedrails?: A Little Help needed moving from lying on your back to sitting on the side of a flat bed without using bedrails?: A Little Help needed moving to and from a bed to a chair (including a wheelchair)?: A Lot Help needed standing up from a chair using your arms (e.g., wheelchair or bedside chair)?: A Lot Help needed to walk in hospital room?: A Lot Help needed climbing 3-5 steps with a railing? : Total 6 Click Score: 13    End of Session Equipment Utilized During Treatment: Gait belt Activity Tolerance: Patient tolerated treatment well Patient left: in bed;with call bell/phone within reach;with bed alarm set;with nursing/sitter in room;with family/visitor present Nurse Communication: Mobility status PT Visit Diagnosis: Muscle weakness (generalized) (M62.81);Difficulty in walking, not elsewhere classified (R26.2)    Time: 7616-0737 PT Time Calculation (min) (ACUTE ONLY): 23 min   Charges:   PT Evaluation $PT Eval High Complexity: 1 High PT Treatments $Therapeutic Exercise: 8-22 mins       Coreen Shippee H. Manson Passey, PT, DPT, NCS 12/24/18, 2:39 PM [phone]

## 2018-12-24 NOTE — Progress Notes (Signed)
Zachary Antigua, MD [address] [place], [place], [place], [address] [address], [place], [place], [place], [address] Phone: [phone]  Fax: [phone]   Subjective:  Patient working alert this morning.  Tolerating oral diet.  No abdominal pain.  No nausea or vomiting.  Objective: Exam: Vital signs in last 24 hours: Vitals:   12/24/18 0800 12/24/18 0900 12/24/18 1100 12/24/18 1200  BP: 92/63 101/73 96/60 100/70  Pulse: (!) 50 (!) 58 (!) 54 (!) 56  Resp: 18 (!) 26 (!) 21 16  Temp:  97.7 F (36.5 C)  97.7 F (36.5 C)  TempSrc:      SpO2: 99% 98% 98% 98%  Weight:      Height:       Weight change:   Intake/Output Summary (Last 24 hours) at 12/24/2018 1303 Last data filed at 12/24/2018 1200 Gross per 24 hour  Intake 754.54 ml  Output 460 ml  Net 294.54 ml    General: No acute distress, AAO x3 Abd: Soft, NT/ND, No HSM Skin: Warm, no rashes Neck: Supple, Trachea midline   Lab Results: Lab Results  Component Value Date   WBC 8.3 12/23/2018   HGB 14.3 12/23/2018   HCT 43.1 12/23/2018   MCV 88.3 12/23/2018   PLT 110 (L) 12/23/2018   Micro Results: Recent Results (from the past 240 hour(s))  SARS Coronavirus 2 [facility] order, Performed in [facility] hospital lab) Nasopharyngeal Nasopharyngeal Swab     Status: None   Collection Time: 12/28/2018  8:33 AM   Specimen: Nasopharyngeal Swab  Result Value Ref Range Status   SARS Coronavirus 2 NEGATIVE NEGATIVE Final    Comment: (NOTE) If result is NEGATIVE SARS-CoV-2 target nucleic acids are NOT DETECTED. The SARS-CoV-2 RNA is generally detectable in upper and lower  respiratory specimens during the acute phase of infection. The lowest  concentration of SARS-CoV-2 viral copies this assay can detect is 250  copies / mL. A negative result does not preclude SARS-CoV-2 infection  and should not be used as the sole basis for treatment or other  patient management decisions.  A negative result may occur with    improper specimen collection / handling, submission of specimen other  than nasopharyngeal swab, presence of viral mutation(s) within the  areas targeted by this assay, and inadequate number of viral copies  (<250 copies / mL). A negative result must be combined with clinical  observations, patient history, and epidemiological information. If result is POSITIVE SARS-CoV-2 target nucleic acids are DETECTED. The SARS-CoV-2 RNA is generally detectable in upper and lower  respiratory specimens dur ing the acute phase of infection.  Positive  results are indicative of active infection with SARS-CoV-2.  Clinical  correlation with patient history and other diagnostic information is  necessary to determine patient infection status.  Positive results do  not rule out bacterial infection or co-infection with other viruses. If result is PRESUMPTIVE POSTIVE SARS-CoV-2 nucleic acids MAY BE PRESENT.   A presumptive positive result was obtained on the submitted specimen  and confirmed on repeat testing.  While 2019 novel coronavirus  (SARS-CoV-2) nucleic acids may be present in the submitted sample  additional confirmatory testing may be necessary for epidemiological  and / or clinical management purposes  to differentiate between  SARS-CoV-2 and other Sarbecovirus currently known to infect humans.  If clinically indicated additional testing with an alternate test  methodology [id number]) is advised. The SARS-CoV-2 RNA is generally  detectable in upper and lower respiratory sp ecimens  during the acute  phase of infection. The expected result is Negative. Fact Sheet for Patients:  BoilerBrush.com.cy Fact Sheet for Healthcare Providers: https://pope.com/ This test is not yet approved or cleared by the [place] FDA and has been authorized for detection and/or diagnosis of SARS-CoV-2 by FDA under an Emergency Use Authorization (EUA).  This EUA will  remain in effect (meaning this test can be used) for the duration of the COVID-19 declaration under Section 564(b)(1) of the Act, 21 U.S.C. section 360bbb-3(b)(1), unless the authorization is terminated or revoked sooner. Performed at [facility], [address], [place], [place] [address]   Urine culture     Status: None   Collection Time: 12/11/2018  8:48 AM   Specimen: In/Out Cath Urine  Result Value Ref Range Status   Specimen Description   Final    IN/OUT CATH URINE Performed at [facility], [address] [place], [place] [address]    Special Requests   Final    NONE Performed at [facility], [address] [place], [place] [address]    Culture   Final    NO GROWTH Performed at [facility], [address], [place], [place] [address]    Report Status 12/21/2018 FINAL  Final  Blood Culture (routine x 2)     Status: None (Preliminary result)   Collection Time: 12/10/2018  8:57 AM   Specimen: BLOOD  Result Value Ref Range Status   Specimen Description BLOOD L ARM  Final   Special Requests   Final    BOTTLES DRAWN AEROBIC AND ANAEROBIC Blood Culture adequate volume   Culture   Final    NO GROWTH 4 DAYS Performed at [facility], [address], [place], [place] [address]    Report Status PENDING  Incomplete  Blood Culture (routine x 2)     Status: None (Preliminary result)   Collection Time: 12/19/2018  8:58 AM   Specimen: BLOOD  Result Value Ref Range Status   Specimen Description BLOOD RAC  Final   Special Requests   Final    BOTTLES DRAWN AEROBIC AND ANAEROBIC Blood Culture adequate volume   Culture   Final    NO GROWTH 4 DAYS Performed at [facility], [address], [place], [place] [address]    Report Status PENDING  Incomplete  MRSA PCR Screening     Status: None   Collection Time: 12/23/18  9:45 AM   Specimen: Nasal Mucosa; Nasopharyngeal  Result Value Ref Range Status   MRSA by PCR NEGATIVE  NEGATIVE Final    Comment:        The GeneXpert MRSA Assay (FDA approved for NASAL specimens only), is one component of a comprehensive MRSA colonization surveillance program. It is not intended to diagnose MRSA infection nor to guide or monitor treatment for MRSA infections. Performed at [facility], [address] [place] [place], [place] [address]    Studies/Results: Dg Chest Port 1 View  Result Date: 2019/01/06 CLINICAL DATA:  Cough EXAM: PORTABLE CHEST 1 VIEW COMPARISON:  12/19/2018 FINDINGS: Heart is normal size. Diffuse bilateral airspace disease. Possible small bilateral effusions. No acute bony abnormality. Old healed left rib fractures. IMPRESSION: Diffuse bilateral airspace disease with normal heart size. Findings concerning for pneumonia. Possible small bilateral effusions. Electronically Signed   By: Charlett Nose M.D.   On: 01/06/2019 22:56   Medications:  Scheduled Meds:  Chlorhexidine Gluconate Cloth  6 each Topical Daily   finasteride  5 mg Oral Daily   heparin injection (subcutaneous)  5,000 Units Subcutaneous Q8H   hydrocortisone sod succinate (SOLU-CORTEF) inj  50 mg Intravenous Q6H   Continuous Infusions:  dexmedetomidine (PRECEDEX) IV infusion 0.3 mcg/kg/hr (12/24/18 0631)   famotidine (PEPCID) IV Stopped (12/23/18 2156)   norepinephrine (LEVOPHED) Adult infusion Stopped (12/23/18 0657)   piperacillin-tazobactam (ZOSYN)  IV 3.375 g (12/24/18 1002)   PRN Meds:.acetaminophen **OR** acetaminophen, bisacodyl, fentaNYL (SUBLIMAZE) injection, ipratropium, levalbuterol, metoprolol tartrate, ondansetron **OR** ondansetron (ZOFRAN) IV, oxyCODONE   Assessment: Active Problems:   Sepsis (HCC)   Calculus of common duct    Plan: Repeat liver enzymes are pending today Patient does not have any evidence of pancreatitis Avoid hepatotoxic drugs Follow-up pending CMP   LOS: 4 days   Melodie Bouillon, MD 12/24/2018, 1:03 PM

## 2018-12-24 NOTE — Progress Notes (Signed)
[place] NAME: Tylon Kemmerling    MR#:  [id number]  DATE OF BIRTH:  April 03, 1932  SUBJECTIVE:  patient has dementia. Poor historian. Status post ERCP. Patient developed significant respiratory distress became hypoxic was transferred to the ICU. Developed bilateral pneumonia. Mentation improving REVIEW OF SYSTEMS:   Review of Systems  Unable to perform ROS: Dementia   Tolerating Diet:npo Tolerating PT: pending  DRUG ALLERGIES:   Allergies  Allergen Reactions  . Tramadol Other (See Comments)    Altered mental status     VITALS:  Blood pressure 92/61, pulse (!) 51, temperature 97.7 F (36.5 C), resp. rate (!) 23, height 5\' 10"  (1.778 m), weight 63.5 kg, SpO2 90 %.  PHYSICAL EXAMINATION:   Physical Exam  GENERAL:  85 y.o.-year-old patient lying in the bed with no acute distress. Thin, cachectic+ EYES: Pupils equal, round, reactive to light and accommodation. No scleral icterus.  HEENT: Head atraumatic, normocephalic. Oropharynx and nasopharynx clear.  NECK:  Supple, no jugular venous distention. No thyroid enlargement, no tenderness.  LUNGS:distant  breath sounds bilaterally, no wheezing, rales, rhonchi. No use of accessory muscles of respiration.  CARDIOVASCULAR: S1, S2 normal. No murmurs, rubs, or gallops.  ABDOMEN: Soft, nontender, nondistended. Bowel sounds present. No organomegaly or mass.  EXTREMITIES: No cyanosis, clubbing or edema b/l.    NEUROLOGIC:unable to assess but grossly non focal PSYCHIATRIC:  patient is alert SKIN: No obvious rash, lesion, or ulcer LABORATORY PANEL:  CBC Recent Labs  Lab 12/23/18 0409  WBC 8.3  HGB 14.3  HCT 43.1  PLT 110*    Chemistries  Recent Labs  Lab 12/24/18 0615  NA 146*  K 4.2  CL 114*  CO2 23  GLUCOSE 129*  BUN 36*  CREATININE 1.25*  CALCIUM 8.0*  MG 2.4  AST 44*  ALT 53*  ALKPHOS 177*  BILITOT 4.4*   Cardiac Enzymes No results for input(s): TROPONINI  in the last 168 hours. RADIOLOGY:  Dg Chest Port 1 View  Result Date: 12/21/2018 CLINICAL DATA:  Cough EXAM: PORTABLE CHEST 1 VIEW COMPARISON:  12/19/2018 FINDINGS: Heart is normal size. Diffuse bilateral airspace disease. Possible small bilateral effusions. No acute bony abnormality. Old healed left rib fractures. IMPRESSION: Diffuse bilateral airspace disease with normal heart size. Findings concerning for pneumonia. Possible small bilateral effusions. Electronically Signed   By: Rolm Baptise M.D.   On: 12/10/2018 22:56   ASSESSMENT AND PLAN:  Amadeo Coke is a 85 y.o. male with history of mild dementia, status post cholecystectomy in 05/2018 who is admitted yesterday secondary to altered mental status compared to his baseline according to patient's wife.   1. Acute hypoxic respiratory failure secondary to multifocal pneumonia with aspiration - may be underlying CHF as well - Continue antibioitics   2. Choledocholithiasis with possible ascending cholangitis -IV fluids,npo  IV zosyn -G.I. recommends monitoring -Elevated LFTs with direct bilirubin--obstructive etio--/Stone CBD -MRCP done today shows possible stone in the common bile duct -s/p ERCP with stone removal from CBD  2. Mild elevated troponin in the setting of right upper quadrant pain -no cardiac history. -Dr. Ubaldo Glassing input noted  3.Dementia at baseline -if patient continues to worsen consider palliative care consultation.  4. Malnutrition with h/o dementia -Dietitian to see    Case discussed with Care Management/Social Worker. Management plans discussed with the patient, nursing and they are in agreement.  CODE STATUS: DNR    TOTAL TIME TAKING CARE OF THIS PATIENT:  15 minutes.  >50% time spent on counselling and coordination of care  POSSIBLE D/C IN * few DAYS, DEPENDING ON CLINICAL CONDITION.  Note: This dictation was prepared with Dragon dictation along with smaller phrase technology. Any transcriptional  errors that result from this process are unintentional.  Delfino Lovett M.D on 12/24/2018 at 5:54 PM  Between 7am to 6pm - Pager - [phone]  After 6pm go to [email or web address] - Social research officer, government  [facility]  Office  [phone]  CC: Primary care physician; Administration, VeteransPatient ID: Prudence Davidson, male   DOB: 1932-11-06, 85 y.o.   MRN: [id number]

## 2018-12-25 ENCOUNTER — Inpatient Hospital Stay: Payer: Medicare Other

## 2018-12-25 LAB — CULTURE, BLOOD (ROUTINE X 2)
Culture: NO GROWTH
Culture: NO GROWTH
Special Requests: ADEQUATE
Special Requests: ADEQUATE

## 2018-12-25 LAB — HEPATIC FUNCTION PANEL
ALT: 48 U/L — ABNORMAL HIGH (ref 0–44)
AST: 37 U/L (ref 15–41)
Albumin: 2.9 g/dL — ABNORMAL LOW (ref 3.5–5.0)
Alkaline Phosphatase: 196 U/L — ABNORMAL HIGH (ref 38–126)
Bilirubin, Direct: 1.6 mg/dL — ABNORMAL HIGH (ref 0.0–0.2)
Indirect Bilirubin: 1.7 mg/dL — ABNORMAL HIGH (ref 0.3–0.9)
Total Bilirubin: 3.3 mg/dL — ABNORMAL HIGH (ref 0.3–1.2)
Total Protein: 5.8 g/dL — ABNORMAL LOW (ref 6.5–8.1)

## 2018-12-25 LAB — CBC WITH DIFFERENTIAL/PLATELET
Abs Immature Granulocytes: 0.03 10*3/uL (ref 0.00–0.07)
Basophils Absolute: 0 10*3/uL (ref 0.0–0.1)
Basophils Relative: 0 %
Eosinophils Absolute: 0 10*3/uL (ref 0.0–0.5)
Eosinophils Relative: 0 %
HCT: 38.6 % — ABNORMAL LOW (ref 39.0–52.0)
Hemoglobin: 12.4 g/dL — ABNORMAL LOW (ref 13.0–17.0)
Immature Granulocytes: 0 %
Lymphocytes Relative: 6 %
Lymphs Abs: 0.4 10*3/uL — ABNORMAL LOW (ref 0.7–4.0)
MCH: 29.4 pg (ref 26.0–34.0)
MCHC: 32.1 g/dL (ref 30.0–36.0)
MCV: 91.5 fL (ref 80.0–100.0)
Monocytes Absolute: 0.6 10*3/uL (ref 0.1–1.0)
Monocytes Relative: 8 %
Neutro Abs: 6.3 10*3/uL (ref 1.7–7.7)
Neutrophils Relative %: 86 %
Platelets: 91 10*3/uL — ABNORMAL LOW (ref 150–400)
RBC: 4.22 MIL/uL (ref 4.22–5.81)
RDW: 15.8 % — ABNORMAL HIGH (ref 11.5–15.5)
WBC: 7.4 10*3/uL (ref 4.0–10.5)
nRBC: 0 % (ref 0.0–0.2)

## 2018-12-25 LAB — BASIC METABOLIC PANEL
Anion gap: 13 (ref 5–15)
Anion gap: 10 (ref 5–15)
BUN: 42 mg/dL — ABNORMAL HIGH (ref 8–23)
BUN: 34 mg/dL — ABNORMAL HIGH (ref 8–23)
CO2: 25 mmol/L (ref 22–32)
CO2: 25 mmol/L (ref 22–32)
Calcium: 8.2 mg/dL — ABNORMAL LOW (ref 8.9–10.3)
Calcium: 8.1 mg/dL — ABNORMAL LOW (ref 8.9–10.3)
Chloride: 105 mmol/L (ref 98–111)
Chloride: 109 mmol/L (ref 98–111)
Creatinine, Ser: 1.32 mg/dL — ABNORMAL HIGH (ref 0.61–1.24)
Creatinine, Ser: 1.11 mg/dL (ref 0.61–1.24)
GFR calc Af Amer: 57 mL/min — ABNORMAL LOW (ref >60)
GFR calc Af Amer: >60 mL/min (ref >60)
GFR calc non Af Amer: 49 mL/min — ABNORMAL LOW (ref >60)
GFR calc non Af Amer: >60 mL/min (ref >60)
Glucose, Bld: 114 mg/dL — ABNORMAL HIGH (ref 70–99)
Glucose, Bld: 95 mg/dL (ref 70–99)
Potassium: 3.7 mmol/L (ref 3.5–5.1)
Potassium: 3.8 mmol/L (ref 3.5–5.1)
Sodium: 143 mmol/L (ref 135–145)
Sodium: 144 mmol/L (ref 135–145)

## 2018-12-25 LAB — PHOSPHORUS: Phosphorus: 1.9 mg/dL — ABNORMAL LOW (ref 2.5–4.6)

## 2018-12-25 LAB — MAGNESIUM: Magnesium: 2.5 mg/dL — ABNORMAL HIGH (ref 1.7–2.4)

## 2018-12-25 LAB — TROPONIN I (HIGH SENSITIVITY): Troponin I (High Sensitivity): 1089 ng/L (ref <18)

## 2018-12-25 LAB — PROCALCITONIN: Procalcitonin: 2.35 ng/mL

## 2018-12-25 MED ORDER — QUETIAPINE FUMARATE 25 MG PO TABS: 25.0000 mg | ORAL_TABLET | Freq: Every day | ORAL | Status: DC

## 2018-12-25 MED ORDER — FAMOTIDINE 20 MG PO TABS: 20.0000 mg | ORAL_TABLET | Freq: Every day | ORAL | Status: DC

## 2018-12-25 MED ORDER — DIPHENHYDRAMINE HCL 50 MG/ML IJ SOLN
12.5000 mg | Freq: Once | INTRAMUSCULAR | Status: AC
  Administered 2018-12-25: 18:00:00 12.5 mg via INTRAVENOUS
  Filled 2018-12-25: qty 1

## 2018-12-25 MED ORDER — LACTATED RINGERS IV SOLN
INTRAVENOUS | Status: DC
  Administered 2018-12-25: 17:00:00 via INTRAVENOUS

## 2018-12-25 MED ORDER — CLONIDINE HCL 0.1 MG PO TABS
0.1000 mg | ORAL_TABLET | Freq: Three times a day (TID) | ORAL | Status: DC
  Administered 2018-12-25: 13:00:00 0.1 mg via ORAL
  Filled 2018-12-25: qty 1

## 2018-12-25 MED ORDER — QUETIAPINE FUMARATE ER 50 MG PO TB24
50.0000 mg | ORAL_TABLET | Freq: Every day | ORAL | Status: DC
  Administered 2018-12-25: 12:00:00 50 mg via ORAL
  Filled 2018-12-25: qty 1

## 2018-12-25 MED ORDER — LORAZEPAM 2 MG/ML IJ SOLN: 0.5000 mg | Freq: Once | INTRAMUSCULAR | Status: DC

## 2018-12-25 MED ORDER — DEXMEDETOMIDINE HCL IN NACL 400 MCG/100ML IV SOLN: 0.4000 ug/kg/h | INTRAVENOUS | Status: DC

## 2018-12-25 MED ORDER — DONEPEZIL HCL 5 MG PO TABS
10.0000 mg | ORAL_TABLET | Freq: Every day | ORAL | Status: DC
  Filled 2018-12-25 (×2): qty 2

## 2018-12-25 MED ORDER — HALOPERIDOL LACTATE 5 MG/ML IJ SOLN
2.0000 mg | Freq: Once | INTRAMUSCULAR | Status: AC
  Administered 2018-12-25: 07:00:00 2 mg via INTRAVENOUS
  Filled 2018-12-25: qty 1

## 2018-12-25 MED ORDER — QUETIAPINE FUMARATE 25 MG PO TABS: 50.0000 mg | ORAL_TABLET | Freq: Every day | ORAL | Status: DC

## 2018-12-25 MED ORDER — LORAZEPAM 2 MG/ML IJ SOLN
0.5000 mg | Freq: Once | INTRAMUSCULAR | Status: AC
  Administered 2018-12-25: 18:00:00 0.5 mg via INTRAVENOUS
  Filled 2018-12-25: qty 1

## 2018-12-25 MED ORDER — POTASSIUM CHLORIDE IN NACL 20-0.9 MEQ/L-% IV SOLN
INTRAVENOUS | Status: DC
  Filled 2018-12-25 (×2): qty 1000

## 2018-12-25 MED ORDER — VALPROIC ACID 250 MG/5ML PO SOLN
250.0000 mg | Freq: Two times a day (BID) | ORAL | Status: DC
  Administered 2018-12-25: 10:00:00 250 mg via ORAL
  Filled 2018-12-25 (×6): qty 5

## 2018-12-25 MED ORDER — POLYETHYLENE GLYCOL 3350 17 G PO PACK
17.0000 g | PACK | Freq: Every day | ORAL | Status: DC
  Administered 2018-12-25: 17 g via ORAL
  Filled 2018-12-25: qty 1

## 2018-12-25 MED ORDER — ATROPINE SULFATE 1 MG/10ML IJ SOSY
1.0000 mg | PREFILLED_SYRINGE | INTRAMUSCULAR | Status: DC | PRN
  Filled 2018-12-25: qty 10

## 2018-12-25 MED ORDER — QUETIAPINE FUMARATE 25 MG PO TABS
25.0000 mg | ORAL_TABLET | Freq: Every day | ORAL | Status: DC
  Administered 2018-12-25: 18:00:00 25 mg via ORAL
  Filled 2018-12-25: qty 1

## 2018-12-25 MED ORDER — POTASSIUM PHOSPHATES 15 MMOLE/5ML IV SOLN
30.0000 mmol | Freq: Once | INTRAVENOUS | Status: AC
  Administered 2018-12-25: 11:00:00 30 mmol via INTRAVENOUS
  Filled 2018-12-25: qty 10

## 2018-12-25 NOTE — Progress Notes (Addendum)
Dr. Loni Muse in with patient and wife. MD made aware that Seroquel, clonodine and intermit precedex used for patient's agitation on/off. Patient in and out of Afib. EKG ordered per Hinton Dyer NP, placed in chart. MD aware of fluctuating heart rate 50's-150's and rhythms. MD to assess patient and speak with wife. At this time no new orders given.

## 2018-12-25 NOTE — Progress Notes (Signed)
PHARMACIST - PHYSICIAN COMMUNICATION  CONCERNING: IV to Oral Route Change Policy  RECOMMENDATION: This patient is receiving famotidine by the intravenous route.  Based on criteria approved by the Pharmacy and Therapeutics Committee, the intravenous medication(s) is/are being converted to the equivalent oral dose form(s).   DESCRIPTION: These criteria include:  The patient is eating (either orally or via tube) and/or has been taking other orally administered medications for a least 24 hours  The patient has no evidence of active gastrointestinal bleeding or impaired GI absorption (gastrectomy, short bowel, patient on TNA or NPO).  If you have questions about this conversion, please contact the Pharmacy Department at [phone].   Simpson,Michael L, [facility] 12/25/2018 4:57 PM

## 2018-12-25 NOTE — Progress Notes (Signed)
Patient agitated at this time. Notified nurse to re-consult VAST when patient calm. VU. Fran Lowes, RN VAST

## 2018-12-25 NOTE — Progress Notes (Signed)
Zachary Repress, MD [address]  [address]  [place], [place] [address]  Main: [phone]  Fax: [phone] Pager: [phone]   Subjective: Patient continues to remain altered.  Poor p.o. intake.  Denies any abdominal pain. Hemodynamically stable  Objective: Vital signs in last 24 hours: Vitals:   12/25/18 1300 12/25/18 1400 12/25/18 1500 12/25/18 1600  BP: 110/79 109/86 104/77 117/72  Pulse:   (!) 56 (!) 56  Resp: 20 12 (!) 22 (!) 26  Temp:  97.6 F (36.4 C)    TempSrc:      SpO2: 94% 93% 96% 98%  Weight:      Height:       Weight change:   Intake/Output Summary (Last 24 hours) at 12/25/2018 1629 Last data filed at 12/25/2018 1600 Gross per 24 hour  Intake 737.02 ml  Output 1200 ml  Net -462.98 ml     Exam: Heart:: Regular rate and rhythm, S1S2 present or without murmur or extra heart sounds Lungs: normal and clear to auscultation Abdomen: soft, nontender, normal bowel sounds   Lab Results: CBC Latest Ref Rng & Units 12/25/2018 12/23/2018 12/25/2018  WBC 4.0 - 10.5 K/uL 7.4 8.3 7.5  Hemoglobin 13.0 - 17.0 g/dL 12.4(L) 14.3 13.6  Hematocrit 39.0 - 52.0 % 38.6(L) 43.1 41.7  Platelets 150 - 400 K/uL 91(L) 110(L) 97(L)   CMP Latest Ref Rng & Units 12/25/2018 12/24/2018 12/23/2018  Glucose 70 - 99 mg/dL 166(A) 630(Z) 601(U)  BUN 8 - 23 mg/dL 93(A) 35(T) 73(U)  Creatinine 0.61 - 1.24 mg/dL 2.02(R) 4.27(C) 6.23  Sodium 135 - 145 mmol/L 143 146(H) 145  Potassium 3.5 - 5.1 mmol/L 3.7 4.2 3.5  Chloride 98 - 111 mmol/L 105 114(H) 113(H)  CO2 22 - 32 mmol/L 25 23 21(L)  Calcium 8.9 - 10.3 mg/dL 8.2(L) 8.0(L) 8.0(L)  Total Protein 6.5 - 8.1 g/dL 7.6(E) 5.3(L) 6.0(L)  Total Bilirubin 0.3 - 1.2 mg/dL 3.3(H) 4.4(H) 7.6(H)  Alkaline Phos 38 - 126 U/L 196(H) 177(H) 250(H)  AST 15 - 41 U/L 37 44(H) 68(H)  ALT 0 - 44 U/L 48(H) 53(H) 79(H)    Micro Results: Recent Results (from the past 240 hour(s))  SARS Coronavirus 2 [facility] order, Performed in [facility]  hospital lab) Nasopharyngeal Nasopharyngeal Swab     Status: None   Collection Time: 12/28/2018  8:33 AM   Specimen: Nasopharyngeal Swab  Result Value Ref Range Status   SARS Coronavirus 2 NEGATIVE NEGATIVE Final    Comment: (NOTE) If result is NEGATIVE SARS-CoV-2 target nucleic acids are NOT DETECTED. The SARS-CoV-2 RNA is generally detectable in upper and lower  respiratory specimens during the acute phase of infection. The lowest  concentration of SARS-CoV-2 viral copies this assay can detect is 250  copies / mL. A negative result does not preclude SARS-CoV-2 infection  and should not be used as the sole basis for treatment or other  patient management decisions.  A negative result may occur with  improper specimen collection / handling, submission of specimen other  than nasopharyngeal swab, presence of viral mutation(s) within the  areas targeted by this assay, and inadequate number of viral copies  (<250 copies / mL). A negative result must be combined with clinical  observations, patient history, and epidemiological information. If result is POSITIVE SARS-CoV-2 target nucleic acids are DETECTED. The SARS-CoV-2 RNA is generally detectable in upper and lower  respiratory specimens dur ing the acute phase of infection.  Positive  results are indicative  of active infection with SARS-CoV-2.  Clinical  correlation with patient history and other diagnostic information is  necessary to determine patient infection status.  Positive results do  not rule out bacterial infection or co-infection with other viruses. If result is PRESUMPTIVE POSTIVE SARS-CoV-2 nucleic acids MAY BE PRESENT.   A presumptive positive result was obtained on the submitted specimen  and confirmed on repeat testing.  While 2019 novel coronavirus  (SARS-CoV-2) nucleic acids may be present in the submitted sample  additional confirmatory testing may be necessary for epidemiological  and / or clinical management  purposes  to differentiate between  SARS-CoV-2 and other Sarbecovirus currently known to infect humans.  If clinically indicated additional testing with an alternate test  methodology [phone]) is advised. The SARS-CoV-2 RNA is generally  detectable in upper and lower respiratory sp ecimens during the acute  phase of infection. The expected result is Negative. Fact Sheet for Patients:  StrictlyIdeas.no Fact Sheet for Healthcare Providers: BankingDealers.co.za This test is not yet approved or cleared by the [place] FDA and has been authorized for detection and/or diagnosis of SARS-CoV-2 by FDA under an Emergency Use Authorization (EUA).  This EUA will remain in effect (meaning this test can be used) for the duration of the COVID-19 declaration under Section 564(b)(1) of the Act, 21 U.S.C. section 360bbb-3(b)(1), unless the authorization is terminated or revoked sooner. Performed at [facility], [address] [place], [place] [address]   Urine culture     Status: None   Collection Time: 12/31/2018  8:48 AM   Specimen: In/Out Cath Urine  Result Value Ref Range Status   Specimen Description   Final    IN/OUT CATH URINE Performed at [facility], [address], [place], [place] [address]    Special Requests   Final    NONE Performed at [facility], [address], [place], [place] [address]    Culture   Final    NO GROWTH Performed at [facility], [place] [address], [place], [place] [address]    Report Status 12/21/2018 FINAL  Final  Blood Culture (routine x 2)     Status: None   Collection Time: 12/23/2018  8:57 AM   Specimen: BLOOD  Result Value Ref Range Status   Specimen Description BLOOD L ARM  Final   Special Requests   Final    BOTTLES DRAWN AEROBIC AND ANAEROBIC Blood Culture adequate volume   Culture   Final    NO GROWTH 5 DAYS Performed at [facility], [address], [place], [place] [address]    Report Status 12/25/2018 FINAL  Final  Blood Culture (routine x 2)     Status: None   Collection Time: 12/08/2018  8:58 AM   Specimen: BLOOD  Result Value Ref Range Status   Specimen Description BLOOD RAC  Final   Special Requests   Final    BOTTLES DRAWN AEROBIC AND ANAEROBIC Blood Culture adequate volume   Culture   Final    NO GROWTH 5 DAYS Performed at [facility], [address] [place], [place] [address]    Report Status 12/25/2018 FINAL  Final  MRSA PCR Screening     Status: None   Collection Time: 12/23/18  9:45 AM   Specimen: Nasal Mucosa; Nasopharyngeal  Result Value Ref Range Status   MRSA by PCR NEGATIVE NEGATIVE Final    Comment:        The GeneXpert MRSA Assay (FDA  approved for NASAL specimens only), is one component of a comprehensive MRSA colonization surveillance program. It is not intended to diagnose MRSA infection nor to guide or monitor treatment for MRSA infections. Performed at [facility], [address], [place], [place] [address]    Studies/Results: Dg Chest Port 1 View  Result Date: 12/25/2018 CLINICAL DATA:  Acute respiratory failure. EXAM: PORTABLE CHEST 1 VIEW COMPARISON:  Radiograph December 22, 2018. FINDINGS: The heart size and mediastinal contours are within normal limits. Mildly decreased bilateral lung opacities are noted suggesting improving pneumonia. No pneumothorax is noted. Small pleural effusions may be present. The visualized skeletal structures are unremarkable. IMPRESSION: Improved bilateral lung opacities compared to prior exam. Electronically Signed   By: Lupita Raider M.D.   On: 12/25/2018 07:02   Medications:  I have reviewed the patient's current medications. Prior to Admission:  Medications Prior to Admission  Medication Sig Dispense Refill Last Dose  . cholecalciferol (VITAMIN D3) 25 MCG (1000 UT) tablet Take 1,000 Units by mouth daily.   12/19/2018 at Unknown time  .  finasteride (PROSCAR) 5 MG tablet Take 5 mg by mouth daily.   12/19/2018 at Unknown time  . montelukast (SINGULAIR) 10 MG tablet Take 10 mg by mouth daily.   12/19/2018 at Unknown time  . oxyCODONE (OXY IR/ROXICODONE) 5 MG immediate release tablet Take 5 mg by mouth 2 (two) times daily as needed for moderate pain or severe pain.   Unknown at PRN  . pregabalin (LYRICA) 100 MG capsule Take 100 mg by mouth 2 (two) times daily.   12/19/2018 at 2000  . vitamin B-12 (CYANOCOBALAMIN) 1000 MCG tablet Take 1,000 mcg by mouth daily.   12/19/2018 at Unknown time   Scheduled: . Chlorhexidine Gluconate Cloth  6 each Topical Daily  . cloNIDine  0.1 mg Oral TID  . donepezil  10 mg Oral QHS  . feeding supplement  1 Container Oral TID BM  . finasteride  5 mg Oral Daily  . heparin injection (subcutaneous)  5,000 Units Subcutaneous Q8H  . polyethylene glycol  17 g Oral Daily  . QUEtiapine  50 mg Oral Daily  . valproic acid  250 mg Oral BID   Continuous: . 0.9 % NaCl with KCl 20 mEq / L    . dexmedetomidine (PRECEDEX) IV infusion    . famotidine (PEPCID) IV Stopped (12/24/18 2337)  . piperacillin-tazobactam (ZOSYN)  IV 3.375 g (12/25/18 1002)   GGY:IRSWNIOEVOJJK **OR** acetaminophen, bisacodyl, fentaNYL (SUBLIMAZE) injection, ipratropium, levalbuterol, metoprolol tartrate, ondansetron **OR** ondansetron (ZOFRAN) IV Anti-infectives (From admission, onward)   Start     Dose/Rate Route Frequency Ordered Stop   12/24/18 0100  vancomycin (VANCOCIN) 1,750 mg in sodium chloride 0.9 % 500 mL IVPB  Status:  Discontinued     1,750 mg 250 mL/hr over 120 Minutes Intravenous Every 24 hours 12/23/18 0117 12/23/18 1114   12/23/18 1800  piperacillin-tazobactam (ZOSYN) IVPB 3.375 g     3.375 g 12.5 mL/hr over 240 Minutes Intravenous Every 8 hours 12/23/18 1114 12/30/18 1759   12/23/18 0000  vancomycin (VANCOCIN) 1,750 mg in sodium chloride 0.9 % 500 mL IVPB  Status:  Discontinued     1,750 mg 250 mL/hr over 120 Minutes  Intravenous Every 24 hours 12/25/2018 2330 12/23/18 0117   12/24/2018 2330  vancomycin (VANCOCIN) 1,500 mg in sodium chloride 0.9 % 500 mL IVPB     1,500 mg 250 mL/hr over 120 Minutes Intravenous  Once 12/19/2018 2325 12/23/18 0746   12/09/2018  2330  meropenem (MERREM) 1 g in sodium chloride 0.9 % 100 mL IVPB  Status:  Discontinued     1 g 200 mL/hr over 30 Minutes Intravenous Every 12 hours 12/11/2018 2325 12/23/18 1114   12/21/18 1200  Ampicillin-Sulbactam (UNASYN) 3 g in sodium chloride 0.9 % 100 mL IVPB  Status:  Discontinued     3 g 200 mL/hr over 30 Minutes Intravenous Every 6 hours 12/21/18 1041 12/13/2018 2308   12/21/2018 1345  cefTRIAXone (ROCEPHIN) 2 g in sodium chloride 0.9 % 100 mL IVPB  Status:  Discontinued     2 g 200 mL/hr over 30 Minutes Intravenous Every 24 hours 12/16/2018 1333 12/21/18 1041   12/08/2018 1345  azithromycin (ZITHROMAX) 500 mg in sodium chloride 0.9 % 250 mL IVPB  Status:  Discontinued     500 mg 250 mL/hr over 60 Minutes Intravenous Every 24 hours 12/04/2018 1333 12/21/18 1041   12/08/2018 0900  ceFEPIme (MAXIPIME) 2 g in sodium chloride 0.9 % 100 mL IVPB     2 g 200 mL/hr over 30 Minutes Intravenous  Once 12/11/2018 0848 12/31/2018 0938   12/21/2018 0900  metroNIDAZOLE (FLAGYL) IVPB 500 mg     500 mg 100 mL/hr over 60 Minutes Intravenous  Once 12/13/2018 0848 12/12/2018 1042   12/17/2018 0900  vancomycin (VANCOCIN) IVPB 1000 mg/200 mL premix     1,000 mg 200 mL/hr over 60 Minutes Intravenous  Once 12/16/2018 0848 12/10/2018 1042     Scheduled Meds: . Chlorhexidine Gluconate Cloth  6 each Topical Daily  . cloNIDine  0.1 mg Oral TID  . donepezil  10 mg Oral QHS  . feeding supplement  1 Container Oral TID BM  . finasteride  5 mg Oral Daily  . heparin injection (subcutaneous)  5,000 Units Subcutaneous Q8H  . polyethylene glycol  17 g Oral Daily  . QUEtiapine  50 mg Oral Daily  . valproic acid  250 mg Oral BID   Continuous Infusions: . 0.9 % NaCl with KCl 20 mEq / L    .  dexmedetomidine (PRECEDEX) IV infusion    . famotidine (PEPCID) IV Stopped (12/24/18 2337)  . piperacillin-tazobactam (ZOSYN)  IV 3.375 g (12/25/18 1002)   PRN Meds:.acetaminophen **OR** acetaminophen, bisacodyl, fentaNYL (SUBLIMAZE) injection, ipratropium, levalbuterol, metoprolol tartrate, ondansetron **OR** ondansetron (ZOFRAN) IV   Assessment: Active Problems:   Sepsis (HCC)   Calculus of common duct   Protein-calorie malnutrition, severe  Choledocholithiasis status post ERCP with biliary sphincterotomy on 9/21 LFTs are improving  Plan: Monitor LFTs Worsening creatinine, start maintenance IV fluids MiraLAX daily Avoid hepatotoxic agents   LOS: 5 days   Zachary Holland 12/25/2018, 4:29 PM

## 2018-12-25 NOTE — Progress Notes (Signed)
This RN went to assess patient with third shift nurse. Patient attempting to pull gloves off, half way out of bed, stating he needed to go. Explained to patient he is in the hospital for pneumonia and concern for safety. Haldol given to patient. Continues to be very agitated. Per Aura Fey NP started precedex. Heart rate 140-150's. Dr. Loni Muse notified and will assess patient. At this time no new orders given.

## 2018-12-25 NOTE — Progress Notes (Signed)
[facility] [place] at [facility]   PATIENT NAME: Zachary Holland    MR#:  [id number]  DATE OF BIRTH:  1932-04-19  SUBJECTIVE:  patient has dementia. Poor historian. Status post ERCP. Patient developed significant respiratory distress became hypoxic was transferred to the ICU.  sleepy REVIEW OF SYSTEMS:   Review of Systems  Unable to perform ROS: Dementia   Tolerating Diet:npo Tolerating PT: pending  DRUG ALLERGIES:   Allergies  Allergen Reactions  . Tramadol Other (See Comments)    Altered mental status     VITALS:  Blood pressure (!) 138/92, pulse 96, temperature 97.6 F (36.4 C), resp. rate (!) 23, height 5\' 10"  (1.778 m), weight 63.5 kg, SpO2 93 %.  PHYSICAL EXAMINATION:   Physical Exam  GENERAL:  85 y.o.-year-old patient lying in the bed with no acute distress. Thin, cachectic+ EYES: Pupils equal, round, reactive to light and accommodation. No scleral icterus.  HEENT: Head atraumatic, normocephalic. Oropharynx and nasopharynx clear.  NECK:  Supple, no jugular venous distention. No thyroid enlargement, no tenderness.  LUNGS:distant  breath sounds bilaterally, no wheezing, rales, rhonchi. No use of accessory muscles of respiration.  CARDIOVASCULAR: S1, S2 normal. No murmurs, rubs, or gallops.  ABDOMEN: Soft, nontender, nondistended. Bowel sounds present. No organomegaly or mass.  EXTREMITIES: No cyanosis, clubbing or edema b/l.    NEUROLOGIC:unable to assess but grossly non focal PSYCHIATRIC:  patient is sleepy SKIN: No obvious rash, lesion, or ulcer LABORATORY PANEL:  CBC Recent Labs  Lab 12/25/18 0415  WBC 7.4  HGB 12.4*  HCT 38.6*  PLT 91*    Chemistries  Recent Labs  Lab 12/24/18 0615 12/25/18 0415  NA 146* 143  K 4.2 3.7  CL 114* 105  CO2 23 25  GLUCOSE 129* 114*  BUN 36* 42*  CREATININE 1.25* 1.32*  CALCIUM 8.0* 8.2*  MG 2.4  --   AST 44* 37  ALT 53* 48*  ALKPHOS 177* 196*  BILITOT 4.4* 3.3*   Cardiac Enzymes No  results for input(s): TROPONINI in the last 168 hours. RADIOLOGY:  Dg Chest Port 1 View  Result Date: 12/25/2018 CLINICAL DATA:  Acute respiratory failure. EXAM: PORTABLE CHEST 1 VIEW COMPARISON:  Radiograph December 22, 2018. FINDINGS: The heart size and mediastinal contours are within normal limits. Mildly decreased bilateral lung opacities are noted suggesting improving pneumonia. No pneumothorax is noted. Small pleural effusions may be present. The visualized skeletal structures are unremarkable. IMPRESSION: Improved bilateral lung opacities compared to prior exam. Electronically Signed   By: December 24, 2018 M.D.   On: 12/25/2018 07:02   ASSESSMENT AND PLAN:  Deago Burruss is a 85 y.o. male with history of mild dementia, status post cholecystectomy in 05/2018 who is admitted yesterday secondary to altered mental status compared to his baseline according to patient's wife.   1. Acute hypoxic respiratory failure secondary to multifocal pneumonia with aspiration - may be underlying CHF as well - Continue zosyn  2. Choledocholithiasis with possible ascending cholangitis -IV fluids,npo  IV zosyn -G.I. recommends monitoring -Elevated LFTs with direct bilirubin--obstructive etio--/Stone CBD -MRCP shows possible stone in the common bile duct -s/p ERCP with stone removal from CBD  2. Mild elevated troponin in the setting of right upper quadrant pain -no cardiac history. -Dr. 06/2018 input noted  3.Dementia at baseline -resume Donepezil and Seroquel   palliative care consultation.  4. Malnutrition with h/o dementia -Dietitian to see    Case discussed with Care Management/Social Worker. Management plans  discussed with the patient, nursing and they are in agreement.  CODE STATUS: DNR    TOTAL TIME TAKING CARE OF THIS PATIENT: 15 minutes.  >50% time spent on counselling and coordination of care  POSSIBLE D/C IN * few DAYS, DEPENDING ON CLINICAL CONDITION.  Note: This dictation  was prepared with Dragon dictation along with smaller phrase technology. Any transcriptional errors that result from this process are unintentional.  Max Sane M.D on 12/25/2018 at 6:04 PM  Between 7am to 6pm - Pager - [phone]  After 6pm go to [email or web address] - Proofreader  [facility]  Office  [phone]  CC: Primary care physician; Administration, VeteransPatient ID: Zachary Holland, male   DOB: 1932-09-29, 85 y.o.   MRN: [id number]

## 2018-12-25 NOTE — Progress Notes (Signed)
Pharmacy Electrolyte Monitoring Consult:  Pharmacy consulted to assist in monitoring and replacing electrolytes in this 85 y.o. male admitted on 12/16/2018 with sepsis; pneumonia vs. GI source. Patient with past medical history significant for dementia and cholecystectomy.  Labs:  Sodium (mmol/L)  Date Value  12/25/2018 143   Potassium (mmol/L)  Date Value  12/25/2018 3.7   Magnesium (mg/dL)  Date Value  12/24/2018 2.4   Phosphorus (mg/dL)  Date Value  12/25/2018 1.9 (L)   Calcium (mg/dL)  Date Value  12/25/2018 8.2 (L)   Albumin (g/dL)  Date Value  12/25/2018 2.9 (L)  Corrected calcium: 9.1  Assessment/Plan:  Will replace for goal potassium ~4 and and goal magnesium ~ 2 while in ICU. Patient now on a clear liquid diet with aspiration precautions. Some new on-set atrial fibrillation noted most recently back in NSR.   --Potassium phosphate 30 mmol IV x1  --BMP/Magnesium/Phosphorus with am labs.   Pharmacy will continue to monitor adjust per consult.   [place] Resident 12/25/2018 2:19 PM

## 2018-12-25 NOTE — Progress Notes (Addendum)
CRITICAL CARE PROGRESS NOTE    Name: Zachary Holland MRN: [id number] DOB: [birth year]     LOS: 5   SUBJECTIVE FINDINGS & SIGNIFICANT EVENTS   Patient description:   85 yo male with limited medical history including mild cognitive impairment brought in for confusion in setting of febrile illness. Found to have cholestatic transaminitis s/p ERCP w/sphinctertotomy for choledocholithiasis.  Once patient on hospital floor post procedure he was noted to have labored breathing and    Lines / Drains: PIV   Cultures / Sepsis markers: Blood cx ntd  Antibiotics: Narrowed to Zosyn    Protocols / Consultants: GI  Tests / Events: S/p ERCP  Overnight: Sedated with benzo and bennadryl now he is exhibiting worsening altered mentation from underlying Alzheimers dementia.    PAST MEDICAL HISTORY   Past Medical History:  Diagnosis Date  . Dementia [facility])      SURGICAL HISTORY   Past Surgical History:  Procedure Laterality Date  . CHOLECYSTECTOMY    . ERCP N/A 12/24/2018   Procedure: ENDOSCOPIC RETROGRADE CHOLANGIOPANCREATOGRAPHY (ERCP);  Surgeon: Midge Minium, MD;  Location: [facility] ENDOSCOPY;  Service: Endoscopy;  Laterality: N/A;     FAMILY HISTORY   History reviewed. No pertinent family history.   SOCIAL HISTORY   Social History   Tobacco Use  . Smoking status: Never Smoker  . Smokeless tobacco: Never Used  Substance Use Topics  . Alcohol use: Not on file  . Drug use: Not on file     MEDICATIONS   Current Medication:  Current Facility-Administered Medications:  .  acetaminophen (TYLENOL) tablet 650 mg, 650 mg, Oral, Q6H PRN, 650 mg at 12/23/2018 1434 **OR** acetaminophen (TYLENOL) suppository 650 mg, 650 mg, Rectal, Q6H PRN, Servando Snare, Darren, MD .  bisacodyl (DULCOLAX) EC tablet 5 mg, 5 mg,  Oral, Daily PRN, Midge Minium, MD .  Chlorhexidine Gluconate Cloth 2 % PADS 6 each, 6 each, Topical, Daily, Eugenie Norrie, NP, 6 each at 12/25/18 1006 .  cloNIDine (CATAPRES) tablet 0.1 mg, 0.1 mg, Oral, TID, Blakeney, Neldon Newport, NP .  dexmedetomidine (PRECEDEX) 400 MCG/100ML (4 mcg/mL) infusion, 0.4-1.2 mcg/kg/hr, Intravenous, Titrated, Blakeney, Neldon Newport, NP, Last Rate: 9.53 mL/hr at 12/25/18 1106, 0.6 mcg/kg/hr at 12/25/18 1106 .  famotidine (PEPCID) IVPB 20 mg premix, 20 mg, Intravenous, QHS, Vida Rigger, MD, Stopped at 12/24/18 2337 .  feeding supplement (BOOST / RESOURCE BREEZE) liquid 1 Container, 1 Container, Oral, TID BM, Vida Rigger, MD, 1 Container at 12/25/18 1006 .  fentaNYL (SUBLIMAZE) injection 12.5-25 mcg, 12.5-25 mcg, Intravenous, Q4H PRN, Eugenie Norrie, NP, 12.5 mcg at 12/24/18 0934 .  finasteride (PROSCAR) tablet 5 mg, 5 mg, Oral, Daily, Servando Snare, Darren, MD, 5 mg at 12/24/18 0926 .  heparin injection 5,000 Units, 5,000 Units, Subcutaneous, Q8H, Enedina Finner, MD, 5,000 Units at 12/25/18 [phone] .  ipratropium (ATROVENT) nebulizer solution 0.5 mg, 0.5 mg, Nebulization, Q6H PRN, Oralia Manis, MD .  levalbuterol [facility]) nebulizer solution 1.25 mg, 1.25 mg, Nebulization, Q6H PRN, Oralia Manis, MD .  metoprolol tartrate (LOPRESSOR) injection 5 mg, 5 mg, Intravenous, Q4H PRN, Enedina Finner, MD, 5 mg at 12/28/2018 1556 .  ondansetron (ZOFRAN) tablet 4 mg, 4 mg, Oral, Q6H PRN **OR** ondansetron (ZOFRAN) injection 4 mg, 4 mg, Intravenous, Q6H PRN, Midge Minium, MD, 4 mg at 12/24/18 0934 .  oxyCODONE (Oxy IR/ROXICODONE) immediate release tablet 5 mg, 5 mg, Oral, Q4H PRN, Eugenie Norrie, NP, 5 mg at 12/25/18 0314 .  piperacillin-tazobactam (ZOSYN) IVPB 3.375 g,  3.375 g, Intravenous, Q8H, Blakeney, Dana G, NP, Last Rate: 100 mL/hr at 12/25/18 1002, 3.375 g at 12/25/18 1002 .  potassium PHOSPHATE 30 mmol in dextrose 5 % 500 mL infusion, 30 mmol, Intravenous, Once, Benita Gutter, RPH, Last  Rate: 125 mL/hr at 12/25/18 1041, 30 mmol at 12/25/18 1041 .  valproic acid (DEPAKENE) solution 250 mg, 250 mg, Oral, BID, Lanney Gins, Merrie Epler, MD, 250 mg at 12/25/18 0955    ALLERGIES   Tramadol    REVIEW OF SYSTEMS    Patient with underlying dementia 10 point ROS negative   PHYSICAL EXAMINATION   Vital Signs: Temp:  [97.7 F (36.5 C)-98.3 F (36.8 C)] 98 F (36.7 C) (09/24 0700) Pulse Rate:  [49-91] 91 (09/24 0700) Resp:  [9-25] 20 (09/24 0300) BP: (86-129)/(57-81) 129/81 (09/24 0700) SpO2:  [89 %-98 %] 92 % (09/24 0700)  GENERAL:confused/demented age apporpriate  HEAD: Normocephalic, atraumatic.  EYES: Pupils equal, round, reactive to light.  No scleral icterus.  MOUTH: Moist mucosal membrane. NECK: Supple. No thyromegaly. No nodules. No JVD.  PULMONARY: bilateral rhonchi and crackles at bases b/l CARDIOVASCULAR: S1 and S2. Regular rate and rhythm. No murmurs, rubs, or gallops.  GASTROINTESTINAL: Soft, nontender, non-distended. No masses. Positive bowel sounds. No hepatosplenomegaly.  MUSCULOSKELETAL: No swelling, clubbing, or edema.  NEUROLOGIC: Mild distress due to acute illness SKIN:intact,warm,dry   PERTINENT DATA     Infusions: . dexmedetomidine (PRECEDEX) IV infusion 0.6 mcg/kg/hr (12/25/18 1106)  . famotidine (PEPCID) IV Stopped (12/24/18 2337)  . piperacillin-tazobactam (ZOSYN)  IV 3.375 g (12/25/18 1002)  . potassium PHOSPHATE IVPB (in mmol) 30 mmol (12/25/18 1041)   Scheduled Medications: . Chlorhexidine Gluconate Cloth  6 each Topical Daily  . cloNIDine  0.1 mg Oral TID  . feeding supplement  1 Container Oral TID BM  . finasteride  5 mg Oral Daily  . heparin injection (subcutaneous)  5,000 Units Subcutaneous Q8H  . valproic acid  250 mg Oral BID   PRN Medications: acetaminophen **OR** acetaminophen, bisacodyl, fentaNYL (SUBLIMAZE) injection, ipratropium, levalbuterol, metoprolol tartrate, ondansetron **OR** ondansetron (ZOFRAN) IV, oxyCODONE  Hemodynamic parameters:   Intake/Output: 09/23 0701 - 09/24 0700 In: 1802 [P.O.:1300; I.V.:60.9; IV Piggyback:196.1] Out: 775 [Urine:775]  Ventilator  Settings:      LAB RESULTS:  Basic Metabolic Panel: Recent Labs  Lab 12/21/18 0445 12/06/2018 0855 12/23/18 0409 12/24/18 0615 12/25/18 0415  NA 142 142 145 146* 143  K 4.2 3.9 3.5 4.2 3.7  CL 112* 109 113* 114* 105  CO2 21* 24 21* 23 25  GLUCOSE 70 118* 130* 129* 114*  BUN 28* 21 25* 36* 42*  CREATININE 1.15 1.05 1.13 1.25* 1.32*  CALCIUM 8.1* 8.1* 8.0* 8.0* 8.2*  MG  --   --  1.9 2.4  --   PHOS  --   --  2.3* 2.7 1.9*   Liver Function Tests: Recent Labs  Lab 12/18/2018 0832 12/21/18 0445 12/10/2018 0855 12/23/18 0409 12/24/18 0615  AST 514* 163* 80* 68* 44*  ALT 315* 173* 110* 79* 53*  ALKPHOS 281* 205* 244* 250* 177*  BILITOT 3.5* 7.4* 6.5* 7.6* 4.4*  PROT 6.5 5.5* 6.1* 6.0* 5.3*  ALBUMIN 3.9 3.1* 3.4* 3.2* 2.7*   Recent Labs  Lab 12/21/18 0445 12/23/18 0409  LIPASE 17 12   No results for input(s): AMMONIA in the last 168 hours. CBC: Recent Labs  Lab 12/25/2018 0832 12/21/18 0445 12/21/2018 0855 12/23/18 0409 12/25/18 0415  WBC 5.7 8.5 7.5 8.3 7.4  NEUTROABS  --   --   --  7.2 6.3  HGB 14.7 12.8* 13.6 14.3 12.4*  HCT 45.0 39.7 41.7 43.1 38.6*  MCV 91.1 91.1 90.7 88.3 91.5  PLT 90* 84* 97* 110* 91*   Cardiac Enzymes: No results for input(s): CKTOTAL, CKMB, CKMBINDEX, TROPONINI in the last 168 hours. BNP: Invalid input(s): POCBNP CBG: Recent Labs  Lab 12/21/18 1546 12/21/18 1605 12/23/18 0220  GLUCAP 56* 102* 109*     IMAGING RESULTS:  Imaging: Dg Chest Port 1 View  Result Date: 12/25/2018 CLINICAL DATA:  Acute respiratory failure. EXAM: PORTABLE CHEST 1 VIEW COMPARISON:  Radiograph December 22, 2018. FINDINGS: The heart size and mediastinal contours are within normal limits. Mildly decreased bilateral lung opacities are noted suggesting improving pneumonia. No pneumothorax is noted.  Small pleural effusions may be present. The visualized skeletal structures are unremarkable. IMPRESSION: Improved bilateral lung opacities compared to prior exam. Electronically Signed   By: Lupita RaiderJames  Green Jr M.D.   On: 12/25/2018 07:02      ASSESSMENT AND PLAN    -Multidisciplinary rounds held today  Acute Hypoxic Respiratory Failure -bilateral airspace disease worse on right per CXR on MICU admission -poss AECHF - TTE with advanced sysotlic chf -poss infectious pneumonia despite normal WBC count due to confusion, elevation of PCT and abnormal CXR -c/w empiric zosyn -mental status is improved    Dementia - Alzheimers type - will start Donepezil and Seroquel  - I do not recommend sedation, narcotics, benzos, antipsychotics nor restraints due to known adverse impact on mentation with underlying dementia.  -Sitter in room please  -sister and mother had Alzherimers dementia   Septic encephalopathy -resolved - treating underlying cause -currently thought due to pneumonia vs GI source  - continue with Zosyn  -has 1:1 sitter , mitts on UEx2    Obstructive Uropathy  - continue home proscar -s/p foley catheter - has been making adequate urine   Sepsis  -due to Pneumonia vs GI source  -follow up cultures- ntd -emperic zosyn  -consider stress dose steroids-cortisol in process   ID -continue IV abx as prescibed -follow up cultures   GI/Nutrition GI PROPHYLAXIS as indicated DIET-->TF's as tolerated Constipation protocol as indicated   ENDO - ICU hypoglycemic\Hyperglycemia protocol -check FSBS per protocol   ELECTROLYTES -follow labs as needed -replace as needed -pharmacy consultation   DVT/GI PRX ordered -heparin 5k [place] q8h TRANSFUSIONS AS NEEDED MONITOR FSBS ASSESS the need for LABS as needed    Critical care provider statement:    Critical care time (minutes):  33   Critical care time was exclusive of:  Separately billable procedures and treating other  patients   Critical care was necessary to treat or prevent imminent or life-threatening deterioration of the following conditions:  sepsis , pneumonia, choledocholithiasis, encephalopathy   Critical care was time spent personally by me on the following activities:  Development of treatment plan with patient or surrogate, discussions with consultants, evaluation of patient's response to treatment, examination of patient, obtaining history from patient or surrogate, ordering and performing treatments and interventions, ordering and review of laboratory studies and re-evaluation of patient's condition.  I assumed direction of critical care for this patient from another provider in my specialty: no    This document was prepared using Dragon voice recognition software and may include unintentional dictation errors.    Vida RiggerFuad Laurinda Carreno, M.D.  Division of Pulmonary & Critical Care Medicine  [facility]

## 2018-12-26 DIAGNOSIS — R509 Fever, unspecified: Secondary | ICD-10-CM

## 2018-12-26 DIAGNOSIS — A419 Sepsis, unspecified organism: Principal | ICD-10-CM

## 2018-12-26 DIAGNOSIS — R05 Cough: Secondary | ICD-10-CM

## 2018-12-26 DIAGNOSIS — J9600 Acute respiratory failure, unspecified whether with hypoxia or hypercapnia: Secondary | ICD-10-CM

## 2018-12-26 DIAGNOSIS — R410 Disorientation, unspecified: Secondary | ICD-10-CM

## 2018-12-26 DIAGNOSIS — Z7189 Other specified counseling: Secondary | ICD-10-CM

## 2018-12-26 DIAGNOSIS — Z515 Encounter for palliative care: Secondary | ICD-10-CM

## 2018-12-26 LAB — BASIC METABOLIC PANEL
Anion gap: 10 (ref 5–15)
BUN: 32 mg/dL — ABNORMAL HIGH (ref 8–23)
CO2: 24 mmol/L (ref 22–32)
Calcium: 8.1 mg/dL — ABNORMAL LOW (ref 8.9–10.3)
Chloride: 110 mmol/L (ref 98–111)
Creatinine, Ser: 0.97 mg/dL (ref 0.61–1.24)
GFR calc Af Amer: >60 mL/min (ref >60)
GFR calc non Af Amer: >60 mL/min (ref >60)
Glucose, Bld: 83 mg/dL (ref 70–99)
Potassium: 3.7 mmol/L (ref 3.5–5.1)
Sodium: 144 mmol/L (ref 135–145)

## 2018-12-26 LAB — CBC
HCT: 42.3 % (ref 39.0–52.0)
Hemoglobin: 13.3 g/dL (ref 13.0–17.0)
MCH: 29.4 pg (ref 26.0–34.0)
MCHC: 31.4 g/dL (ref 30.0–36.0)
MCV: 93.6 fL (ref 80.0–100.0)
Platelets: 99 10*3/uL — ABNORMAL LOW (ref 150–400)
RBC: 4.52 MIL/uL (ref 4.22–5.81)
RDW: 15.9 % — ABNORMAL HIGH (ref 11.5–15.5)
WBC: 7.2 10*3/uL (ref 4.0–10.5)
nRBC: 0 % (ref 0.0–0.2)

## 2018-12-26 LAB — HEPATIC FUNCTION PANEL
ALT: 42 U/L (ref 0–44)
AST: 34 U/L (ref 15–41)
Albumin: 3.1 g/dL — ABNORMAL LOW (ref 3.5–5.0)
Alkaline Phosphatase: 175 U/L — ABNORMAL HIGH (ref 38–126)
Bilirubin, Direct: 1.4 mg/dL — ABNORMAL HIGH (ref 0.0–0.2)
Indirect Bilirubin: 1.5 mg/dL — ABNORMAL HIGH (ref 0.3–0.9)
Total Bilirubin: 2.9 mg/dL — ABNORMAL HIGH (ref 0.3–1.2)
Total Protein: 5.9 g/dL — ABNORMAL LOW (ref 6.5–8.1)

## 2018-12-26 LAB — TROPONIN I (HIGH SENSITIVITY): Troponin I (High Sensitivity): 838 ng/L (ref <18)

## 2018-12-26 LAB — MAGNESIUM: Magnesium: 2.5 mg/dL — ABNORMAL HIGH (ref 1.7–2.4)

## 2018-12-26 LAB — PHOSPHORUS: Phosphorus: 2.4 mg/dL — ABNORMAL LOW (ref 2.5–4.6)

## 2018-12-26 MED ORDER — HALOPERIDOL LACTATE 5 MG/ML IJ SOLN: 0.5000 mg | INTRAMUSCULAR | Status: DC | PRN

## 2018-12-26 MED ORDER — LORAZEPAM 2 MG/ML IJ SOLN: 0.5000 mg | INTRAMUSCULAR | Status: DC | PRN

## 2018-12-26 MED ORDER — LORAZEPAM 2 MG/ML PO CONC: 1.0000 mg | ORAL | Status: DC | PRN

## 2018-12-26 MED ORDER — MORPHINE SULFATE (PF) 4 MG/ML IV SOLN: 4.0000 mg | Freq: Once | INTRAVENOUS | Status: DC

## 2018-12-26 MED ORDER — OLANZAPINE 5 MG PO TBDP
2.5000 mg | ORAL_TABLET | Freq: Two times a day (BID) | ORAL | Status: DC | PRN
  Filled 2018-12-26: qty 0.5

## 2018-12-26 MED ORDER — LORAZEPAM 2 MG/ML IJ SOLN
INTRAMUSCULAR | Status: AC
  Filled 2018-12-26: qty 1

## 2018-12-26 MED ORDER — BIOTENE DRY MOUTH MT LIQD: 15.0000 mL | OROMUCOSAL | Status: DC | PRN

## 2018-12-26 MED ORDER — LORAZEPAM 2 MG/ML IJ SOLN
1.0000 mg | Freq: Once | INTRAMUSCULAR | Status: AC
  Administered 2018-12-26: 20:00:00 1 mg via INTRAVENOUS
  Filled 2018-12-26: qty 1

## 2018-12-26 MED ORDER — POTASSIUM PHOSPHATES 15 MMOLE/5ML IV SOLN
20.0000 mmol | Freq: Once | INTRAVENOUS | Status: DC
  Administered 2018-12-26: 10:00:00 20 mmol via INTRAVENOUS
  Filled 2018-12-26: qty 6.67

## 2018-12-26 MED ORDER — OLANZAPINE 5 MG PO TBDP
2.5000 mg | ORAL_TABLET | Freq: Every day | ORAL | Status: DC
  Filled 2018-12-26: qty 0.5

## 2018-12-26 MED ORDER — MORPHINE SULFATE (PF) 2 MG/ML IV SOLN
INTRAVENOUS | Status: AC
  Administered 2018-12-26: 18:00:00 4 mg via INTRAVENOUS
  Filled 2018-12-26: qty 2

## 2018-12-26 MED ORDER — LORAZEPAM 2 MG/ML IJ SOLN
0.5000 mg | Freq: Once | INTRAMUSCULAR | Status: AC
  Administered 2018-12-26: 0.5 mg via INTRAVENOUS

## 2018-12-26 MED ORDER — LORAZEPAM 2 MG/ML IJ SOLN
0.5000 mg | Freq: Once | INTRAMUSCULAR | Status: AC
  Administered 2018-12-26: 01:00:00 0.5 mg via INTRAVENOUS

## 2018-12-26 MED ORDER — GLYCOPYRROLATE 0.2 MG/ML IJ SOLN: 0.2000 mg | INTRAMUSCULAR | Status: DC | PRN

## 2018-12-26 MED ORDER — HALOPERIDOL LACTATE 2 MG/ML PO CONC
0.5000 mg | ORAL | Status: DC | PRN
  Filled 2018-12-26: qty 0.3

## 2018-12-26 MED ORDER — MORPHINE 100MG IN NS 100ML (1MG/ML) PREMIX INFUSION
1.0000 mg/h | INTRAVENOUS | Status: DC
  Administered 2018-12-26: 18:00:00 5 mg/h via INTRAVENOUS
  Filled 2018-12-26: qty 100

## 2018-12-26 MED ORDER — MORPHINE SULFATE (PF) 2 MG/ML IV SOLN
1.0000 mg | INTRAVENOUS | Status: DC | PRN
  Administered 2018-12-26: 2 mg via INTRAVENOUS
  Filled 2018-12-26: qty 1

## 2018-12-26 MED ORDER — POLYVINYL ALCOHOL 1.4 % OP SOLN
1.0000 [drp] | Freq: Four times a day (QID) | OPHTHALMIC | Status: DC | PRN
  Filled 2018-12-26: qty 15

## 2018-12-26 MED ORDER — MORPHINE BOLUS VIA INFUSION
1.0000 mg | INTRAVENOUS | Status: DC | PRN
  Filled 2018-12-26: qty 2

## 2018-12-27 NOTE — Progress Notes (Signed)
Pt expired 12/30/2018 at 2252; confirmed by RN x 2. Pt's wife and daughter at bedside, additional family members present via [place]. House Supervisor notified, CDS notified, charge nurse notified. All pt belongings taken home by family.

## 2018-12-27 NOTE — Progress Notes (Signed)
   12/21/2018 0600  Clinical Encounter Type  Visited With Family;Patient and family together  Visit Type Initial;Spiritual support;Death  Referral From Nurse  Spiritual Encounters  Spiritual Needs Prayer;Emotional;Grief support  Stress Factors  Patient Stress Factors Other (Comment)  Family Stress Factors Loss;Major life changes

## 2019-01-01 NOTE — Progress Notes (Addendum)
Pharmacy Electrolyte Monitoring Consult:  Pharmacy consulted to assist in monitoring and replacing electrolytes in this 85 y.o. male admitted on 12/07/2018 with sepsis; pneumonia vs. GI source. Patient with past medical history significant for dementia and cholecystectomy.  Labs:  Sodium (mmol/L)  Date Value  12/13/2018 144   Potassium (mmol/L)  Date Value  12/29/2018 3.7   Magnesium (mg/dL)  Date Value  12/23/2018 2.5 (H)   Phosphorus (mg/dL)  Date Value  12/19/2018 2.4 (L)   Calcium (mg/dL)  Date Value  12/23/2018 8.1 (L)   Albumin (g/dL)  Date Value  12/07/2018 3.1 (L)   Assessment/Plan:  Will replace for goal potassium ~4 and and goal magnesium ~ 2 while in ICU. Patient now on a clear liquid diet with aspiration precautions. Some new on-set atrial fibrillation noted most recently back in NSR.   --Potassium phosphate 20 mmol IV x1  --BMP , Phosphorus with AM labs.   Pharmacy will continue to monitor adjust per consult.   [place] Resident 12/11/2018 1:20 PM

## 2019-01-01 NOTE — Progress Notes (Signed)
Pt had brief episode of bradycardia (HR upper 20's-30's), of which he converted back to NSR.  BP remained stable.  Order given for STAT EKG, Troponin, and electrolytes.    EKG not concerning for ST Depression/Elevation, but did reveal prolonged QT interval (548 ms).  High sensitivity Troponin increased to 1,089 (previous value 73 on 2019-01-13).  Unable to question pt regarding chest pain as he is confused and delirious. Considering Heparin drip, but have paged GI to be sure they are ok with Heparin as pt underwent ERCP with bilateral sphincterotomy 3 days ago on 12/14/2018.  Awaiting callback from GI.  Will continue to trend troponin.  Will consult Cardiology.   Darel Hong, AGACNP-BC [facility] Pager: [phone] Cell: [phone]

## 2019-01-01 NOTE — Death Summary Note (Signed)
DEATH SUMMARY   Patient Details  Name: Zachary Holland MRN: [id number] DOB: October 24, 1932  Admission/Discharge Information   Admit Date:  11-Jan-2019  Date of Death: Date of Death: 01-17-2019  Time of Death: Time of Death: Aug 22, 2250  Length of Stay: 7  Referring Physician: [facility], [facility]   Reason(s) for Hospitalization  sepsis  Diagnoses  Preliminary cause of death:  Secondary Diagnoses (including complications and co-morbidities):  Active Problems:   Sepsis (HCC)   Calculus of common duct   Protein-calorie malnutrition, severe   Brief Hospital Course (including significant findings, care, treatment, and services provided and events leading to death)  Zachary Holland is a 85 y.o. year old male  with limited medical history including mild cognitive impairment brought in for confusion in setting of febrile illness. Found to have cholestatic transaminitis s/p ERCP w/sphinctertotomy for choledocholithiasis.  Once patient on hospital floor post procedure he was noted to have labored breathing and became more agitated and had respiratory distress which required BiPAP and transfer to ICU. 9/21 CXR concerning for worsening pneumonia.He was seen by GI, cardiology, neurology and palliative care. His agitation and delirium continued despite interventions. In consultation with the family, patient was made comfort care and expired peacefully  Pertinent Labs and Studies  Significant Diagnostic Studies Mr Abdomen Wo Contrast  Result Date: 12/21/2018 CLINICAL DATA:  Abnormal LFTs, nausea/vomiting, confusion EXAM: MRI ABDOMEN WITHOUT CONTRAST TECHNIQUE: Multiplanar multisequence MR imaging was performed without the administration of intravenous contrast. COMPARISON:  Right upper quadrant ultrasound dated Jan 11, 2019 FINDINGS: Severely motion degraded study. Study was also aborted prematurely due to patient discomfort. Lower chest: Lung bases are clear. Hepatobiliary: Small hepatic cysts measuring  up to 13 mm in the right hepatic lobe (series 4/image 15). Status post cholecystectomy. No intrahepatic ductal dilatation. Common duct measures 9 mm, within the upper limits of normal for postsurgical appearance. However, there is a possible 6 mm distal CBD stone at the ampulla (series 6/image 7), although poorly visualized/equivocal. Pancreas:  Grossly unremarkable. Spleen:  Within normal limits. Adrenals/Urinary Tract:  Adrenal glands are within normal limits. Kidneys are grossly unremarkable. Fullness of the right renal collecting system. Mild left hydronephrosis with prominent extrarenal pelvis (series 6/image 8). This appearance may be related to bladder distention. Stomach/Bowel: Stomach is within normal limits. Visualized bowel is grossly unremarkable. Vascular/Lymphatic:  No evidence of abdominal aortic aneurysm. No suspicious abdominal lymphadenopathy. Other:  Trace perihepatic ascites. Musculoskeletal: No focal osseous lesions. IMPRESSION: Markedly limited evaluation due to severe motion degradation. Study was also ordered prematurely due to patient discomfort. Status post cholecystectomy. Common duct measures 9 mm, within the upper limits of normal. However, there is a possible 6 mm distal CBD stone at the ampulla, poorly visualized/equivocal. Consider ERCP as clinically warranted. Mild left hydronephrosis with prominent extrarenal pelvis. Fullness of the right renal collecting system. This appearance may be related to bladder distension. Electronically Signed   By: Charline Bills M.D.   On: 12/21/2018 10:22   Dg Chest Port 1 View  Result Date: 12/25/2018 CLINICAL DATA:  Acute respiratory failure. EXAM: PORTABLE CHEST 1 VIEW COMPARISON:  Radiograph December 22, 2018. FINDINGS: The heart size and mediastinal contours are within normal limits. Mildly decreased bilateral lung opacities are noted suggesting improving pneumonia. No pneumothorax is noted. Small pleural effusions may be present. The  visualized skeletal structures are unremarkable. IMPRESSION: Improved bilateral lung opacities compared to prior exam. Electronically Signed   By: Lupita Raider M.D.   On: 12/25/2018 07:02   Dg  Chest Port 1 View  Result Date: 12/09/2018 CLINICAL DATA:  Cough EXAM: PORTABLE CHEST 1 VIEW COMPARISON:  12/21/2018 FINDINGS: Heart is normal size. Diffuse bilateral airspace disease. Possible small bilateral effusions. No acute bony abnormality. Old healed left rib fractures. IMPRESSION: Diffuse bilateral airspace disease with normal heart size. Findings concerning for pneumonia. Possible small bilateral effusions. Electronically Signed   By: Charlett NoseKevin  Dover M.D.   On: 12/21/2018 22:56   Dg Chest Port 1 View  Result Date: 12/04/2018 CLINICAL DATA:  Pt came via EMS from home for altered mental status. Per wife, pt does have hx of dementia but is able to talk and not normally confused at baseline. Per wife, pt woke up this am vomiting, moaning, and shivering. Pt told EMS, "hurts all over". Pt is shivering on arrival and not speaking when asked what his name and birthday wasa EXAM: PORTABLE CHEST 1 VIEW COMPARISON:  None. FINDINGS: Cardiac silhouette is normal in size. No mediastinal hilar masses. No evidence of adenopathy. Relatively small area of hazy airspace type opacities in the right upper lobe. Prominent bronchovascular markings in the medial lung bases. Lungs otherwise clear. No pleural effusion or pneumothorax. There left rib fractures.  No acute skeletal abnormality. IMPRESSION: 1. Small area of hazy opacity in the right upper lobe consistent pneumonia. Electronically Signed   By: Amie Portlandavid  Ormond M.D.   On: 12/03/2018 09:31   Dg C-arm 1-60 Min-no Report  Result Date: 12/16/2018 Fluoroscopy was utilized by the requesting physician.  No radiographic interpretation.   [place] Abdomen Limited Ruq  Result Date: 12/15/2018 CLINICAL DATA:  Elevated liver function test. EXAM: ULTRASOUND ABDOMEN LIMITED RIGHT UPPER  QUADRANT COMPARISON:  None. FINDINGS: Gallbladder: Surgically absent. Common bile duct: Diameter: 10 mm Liver: Increased parenchymal echogenicity. No liver mass or focal lesion. Portal vein is patent on color Doppler imaging with normal direction of blood flow towards the liver. Other: None. IMPRESSION: 1. No acute findings. 2. Increased liver parenchymal echogenicity consistent with hepatic steatosis. 3. Chronic common bile duct dilation.  Status post cholecystectomy. Electronically Signed   By: Amie Portlandavid  Ormond M.D.   On: 12/15/2018 11:59    Microbiology Recent Results (from the past 240 hour(s))  SARS Coronavirus 2 [facility](Hospital order, Per[facility]) Nasopharyngeal Nasopharyngeal Swab     Status: None   Collection Time: 12/17/2018  8:33 AM   Specimen: Nasopharyngeal Swab  Result Value Ref Range Status   SARS Coronavirus 2 NEGATIVE NEGATIVE Final    Comment: (NOTE) If result is NEGATIVE SARS-CoV-2 target nucleic acids are NOT DETECTED. The SARS-CoV-2 RNA is generally detectable in upper and lower  respiratory specimens during the acute phase of infection. The lowest  concentration of SARS-CoV-2 viral copies this assay can detect is 250  copies / mL. A negative result does not preclude SARS-CoV-2 infection  and should not be used as the sole basis for treatment or other  patient management decisions.  A negative result may occur with  improper specimen collection / handling, submission of specimen other  than nasopharyngeal swab, presence of viral mutation(s) within the  areas targeted by this assay, and inadequate number of viral copies  (<250 copies / mL). A negative result must be combined with clinical  observations, patient history, and epidemiological information. If result is POSITIVE SARS-CoV-2 target nucleic acids are DETECTED. The SARS-CoV-2 RNA is generally detectable in upper and lower  respiratory specimens dur ing the acute phase of infection.  Positive   results are indicative of  active infection with SARS-CoV-2.  Clinical  correlation with patient history and other diagnostic information is  necessary to determine patient infection status.  Positive results do  not rule out bacterial infection or co-infection with other viruses. If result is PRESUMPTIVE POSTIVE SARS-CoV-2 nucleic acids MAY BE PRESENT.   A presumptive positive result was obtained on the submitted specimen  and confirmed on repeat testing.  While 2019 novel coronavirus  (SARS-CoV-2) nucleic acids may be present in the submitted sample  additional confirmatory testing may be necessary for epidemiological  and / or clinical management purposes  to differentiate between  SARS-CoV-2 and other Sarbecovirus currently known to infect humans.  If clinically indicated additional testing with an alternate test  methodology [id number]) is advised. The SARS-CoV-2 RNA is generally  detectable in upper and lower respiratory sp ecimens during the acute  phase of infection. The expected result is Negative. Fact Sheet for Patients:  BoilerBrush.com.cy Fact Sheet for Healthcare Providers: https://pope.com/ This test is not yet approved or cleared by the [place] FDA and has been authorized for detection and/or diagnosis of SARS-CoV-2 by FDA under an Emergency Use Authorization (EUA).  This EUA will remain in effect (meaning this test can be used) for the duration of the COVID-19 declaration under Section 564(b)(1) of the Act, 21 U.S.C. section 360bbb-3(b)(1), unless the authorization is terminated or revoked sooner. Performed at [facility], [address], [place], [place] [address]   Urine culture     Status: None   Collection Time: 12/05/2018  8:48 AM   Specimen: In/Out Cath Urine  Result Value Ref Range Status   Specimen Description   Final    IN/OUT CATH URINE Performed at [facility], [address], [place], [place] [address]    Special Requests   Final    NONE Performed at [facility], [address] [place], [place] [address]    Culture   Final    NO GROWTH Performed at [facility], [address] [place], [place] [address]    Report Status 12/21/2018 FINAL  Final  Blood Culture (routine x 2)     Status: None   Collection Time: 12/07/2018  8:57 AM   Specimen: BLOOD  Result Value Ref Range Status   Specimen Description BLOOD L ARM  Final   Special Requests   Final    BOTTLES DRAWN AEROBIC AND ANAEROBIC Blood Culture adequate volume   Culture   Final    NO GROWTH 5 DAYS Performed at [facility], [address], [place], [place] [address]    Report Status 12/25/2018 FINAL  Final  Blood Culture (routine x 2)     Status: None   Collection Time: 12/09/2018  8:58 AM   Specimen: BLOOD  Result Value Ref Range Status   Specimen Description BLOOD RAC  Final   Special Requests   Final    BOTTLES DRAWN AEROBIC AND ANAEROBIC Blood Culture adequate volume   Culture   Final    NO GROWTH 5 DAYS Performed at [facility], [address], [place], [place] [address]    Report Status 12/25/2018 FINAL  Final  MRSA PCR Screening     Status: None   Collection Time: 12/23/18  9:45 AM   Specimen: Nasal Mucosa; Nasopharyngeal  Result Value Ref Range Status   MRSA by PCR NEGATIVE NEGATIVE Final    Comment:        The GeneXpert MRSA Assay (FDA approved for  NASAL specimens only), is one component of a comprehensive MRSA colonization surveillance program. It is not intended to diagnose MRSA infection nor to guide or monitor treatment for MRSA infections. Performed at [facility], [address] [place], [place] [address]     Lab Basic Metabolic Panel: Recent Labs  Lab 12/23/18 [phone] 12/24/18 0615 12/25/18 0415 12/25/18 2255 12/25/2018 0344  NA 145 146* 143 144 144  K 3.5 4.2 3.7 3.8 3.7  CL 113* 114* 105 109 110  CO2 21* 23 25 25  24   GLUCOSE 130* 129* 114* 95 83  BUN 25* 36* 42* 34* 32*  CREATININE 1.13 1.25* 1.32* 1.11 0.97  CALCIUM 8.0* 8.0* 8.2* 8.1* 8.1*  MG 1.9 2.4  --  2.5* 2.5*  PHOS 2.3* 2.7 1.9*  --  2.4*   Liver Function Tests: Recent Labs  Lab 12/18/2018 0855 12/23/18 0409 12/24/18 0615 12/25/18 0415 12/03/2018 0038  AST 80* 68* 44* 37 34  ALT 110* 79* 53* 48* 42  ALKPHOS 244* 250* 177* 196* 175*  BILITOT 6.5* 7.6* 4.4* 3.3* 2.9*  PROT 6.1* 6.0* 5.3* 5.8* 5.9*  ALBUMIN 3.4* 3.2* 2.7* 2.9* 3.1*   Recent Labs  Lab 12/21/18 0445 12/23/18 0409  LIPASE 17 12   No results for input(s): AMMONIA in the last 168 hours. CBC: Recent Labs  Lab 12/21/18 0445 12/25/2018 0855 12/23/18 0409 12/25/18 0415 12/21/2018 0344  WBC 8.5 7.5 8.3 7.4 7.2  NEUTROABS  --   --  7.2 6.3  --   HGB 12.8* 13.6 14.3 12.4* 13.3  HCT 39.7 41.7 43.1 38.6* 42.3  MCV 91.1 90.7 88.3 91.5 93.6  PLT 84* 97* 110* 91* 99*   Cardiac Enzymes: No results for input(s): CKTOTAL, CKMB, CKMBINDEX, TROPONINI in the last 168 hours. Sepsis Labs: Recent Labs  Lab 12/25/2018 0843 12/25/2018 1047  12/17/2018 0855 12/23/18 0409 12/23/18 1357 12/24/18 0615 12/25/18 0415 12/16/2018 0344  PROCALCITON  --   --   --   --  7.99 6.79 4.26 2.35  --   WBC  --   --    < > 7.5 8.3  --   --  7.4 7.2  LATICACIDVEN 2.0* 1.9  --   --   --   --   --   --   --    < > = values in this interval not displayed.    Procedures/Operations  ERCP  Magddalene S Tukov-Yual 01/23/2019, 12:09 AM

## 2019-01-01 NOTE — Progress Notes (Signed)
Brief GI note:  LFTs are improving post ERCP Patient remains altered Avoid hepatotoxic agents No further work-up from GI standpoint, GI will sign off for now Patient's call us back with questions or concerns  Cephas Darby, MD [place]  [place],  [address]  Main: [phone]  Fax: [phone] Pager: [phone]

## 2019-01-01 NOTE — Consult Note (Addendum)
Consultation Note Date: January 08, 2019   Patient Name: Zachary Holland  DOB: May 11, 1932  MRN: [id number]  Age / Sex: 85 y.o., male  PCP: Administration, [facility] Referring Physician: Max Sane, MD  Reason for Consultation: Establishing goals of care  HPI/Patient Profile: Zachary Holland  is a 85 y.o. male with a known history of dementia and prostate problem.  The patient is confused and unresponsive to verbal stimuli.  Clinical Assessment and Goals of Care: Patient is lying with head reclined in bed with staff caring for him. His gown is down, and he appears mottled on his torso and thighs. His feet are cool to the touch. His mouth is open and his eyes are half closed. He is not responsive. His wife is at bedside.   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 She states pride, dignity, and independence are very important to him. She states he is a private man and does not like to see doctors. She discusses his decline beginning in February. She states EMS was called when he was unresponsive at that time, and his LOC improved and he refused transport. He finally consented to go to the hospital and have surgery when he was threatened with death. He had a difficult time with altered mental status at that time which was attributed to anesthesia.    She states he has not been the same since then. She states his mental status has declined since Febuary. She states over the past 2 weeks, he was greatly improved and "acting normal", as he was even joking, and so she was encouraged. Saturday, he got up in the middle of the night and fell back into bed and finally adjusted himself and went back to sleep. She states he did not awaken when the alarm clock went off the next morning and when she looked, he was covered in vomit and unresponsive.    We discussed his diagnoses, prognosis, GOC, EOL wishes disposition and options.   A detailed discussion was had today regarding advanced directives.  Concepts specific to code status, artifical feeding and hydration, IV antibiotics and rehospitalization were discussed.  The difference between an aggressive medical intervention path and a comfort care path was discussed.  Values and goals of care important to patient and family were attempted to be elicited.  Discussed limitations of medical interventions to prolong quality of life in some situations and discussed the concept of human mortality.  She states she feels like he is dying. She states they have a close walk with God, and he is ready if it is his time. She does not want him to suffer and is concerned for his dignity. She would like to shift to comfort care until end of life.    I completed a MOST form today and the signed original was placed in the chart. A photocopy was placed in the chart to be scanned into EMR. The patient's wife outlined their wishes for the following treatment decisions:  Cardiopulmonary Resuscitation: Do Not Attempt Resuscitation (DNR/No CPR)  Medical Interventions: Comfort Measures: Keep clean, warm, and dry. Use medication by any route, positioning, wound care, and other measures to relieve pain and suffering. Use oxygen, suction and manual treatment of airway obstruction as needed for comfort. Do not transfer to the hospital unless comfort needs cannot be met in current location.  Antibiotics: No antibiotics (use other measures to relieve symptoms)  IV Fluids: No IV fluids (provide other measures to ensure comfort)  Feeding Tube: No feeding tube              SUMMARY OF RECOMMENDATIONS    Shifting to comfort care. Anticipate hospital death.   Prognosis:   Hours - Days Not responsive. Elevated troponin. Mottled.   Discharge Planning: Anticipated Hospital Death      Primary Diagnoses: Present on Admission: . Sepsis ([place])   I have reviewed the medical record, interviewed the patient  and family, and examined the patient. The following aspects are pertinent.  Past Medical History:  Diagnosis Date  . Dementia [facility])    Social History   Socioeconomic History  . Marital status: Married    Spouse name: Not on file  . Number of children: Not on file  . Years of education: Not on file  . Highest education level: Not on file  Occupational History  . Not on file  Social Needs  . Financial resource strain: Not on file  . Food insecurity    Worry: Not on file    Inability: Not on file  . Transportation needs    Medical: Not on file    Non-medical: Not on file  Tobacco Use  . Smoking status: Never Smoker  . Smokeless tobacco: Never Used  Substance and Sexual Activity  . Alcohol use: Not on file  . Drug use: Not on file  . Sexual activity: Not on file  Lifestyle  . Physical activity    Days per week: Not on file    Minutes per session: Not on file  . Stress: Not on file  Relationships  . Social Herbalist on phone: Not on file    Gets together: Not on file    Attends religious service: Not on file    Active member of club or organization: Not on file    Attends meetings of clubs or organizations: Not on file    Relationship status: Not on file  Other Topics Concern  . Not on file  Social History Narrative  . Not on file   History reviewed. No pertinent family history. Scheduled Meds: . Chlorhexidine Gluconate Cloth  6 each Topical Daily  . donepezil  10 mg Oral QHS  . famotidine  20 mg Oral QHS  . feeding supplement  1 Container Oral TID BM  . finasteride  5 mg Oral Daily  . heparin injection (subcutaneous)  5,000 Units Subcutaneous Q8H  . OLANZapine zydis  2.5 mg Oral QHS  . polyethylene glycol  17 g Oral Daily  . valproic acid  250 mg Oral BID   Continuous Infusions: . potassium PHOSPHATE IVPB (in mmol) 20 mmol (December 27, 2018 1017)   PRN Meds:.acetaminophen **OR** acetaminophen, bisacodyl, ipratropium, levalbuterol, metoprolol tartrate,  OLANZapine zydis, ondansetron **OR** ondansetron (ZOFRAN) IV Medications Prior to Admission:  Prior to Admission medications   Medication Sig Start Date End Date Taking? Authorizing Provider  cholecalciferol (VITAMIN D3) 25 MCG (1000 UT) tablet Take 1,000 Units by mouth daily.   Yes [provider]  finasteride (PROSCAR) 5 MG tablet Take 5  mg by mouth daily.   Yes [provider]  montelukast (SINGULAIR) 10 MG tablet Take 10 mg by mouth daily.   Yes [provider]  oxyCODONE (OXY IR/ROXICODONE) 5 MG immediate release tablet Take 5 mg by mouth 2 (two) times daily as needed for moderate pain or severe pain.   Yes [provider]  pregabalin (LYRICA) 100 MG capsule Take 100 mg by mouth 2 (two) times daily.   Yes [provider]  vitamin B-12 (CYANOCOBALAMIN) 1000 MCG tablet Take 1,000 mcg by mouth daily.   Yes [provider]   Allergies  Allergen Reactions  . Tramadol Other (See Comments)    Altered mental status    Review of Systems  Unable to perform ROS   Physical Exam Constitutional:      Comments: Eyes half open, unresponsive.     Vital Signs: BP (!) 132/98   Pulse (!) 118   Temp 97.7 F (36.5 C) (Axillary)   Resp (!) 44   Ht '5\' 10"'$  (1.778 m)   Wt 63.5 kg   SpO2 90%   BMI 20.09 kg/m  Pain Scale: PAINAD POSS *See Group Information*: 2-Acceptable,Slightly drowsy, easily aroused Pain Score: Asleep   SpO2: SpO2: 90 % O2 Device:SpO2: 90 % O2 Flow Rate: .O2 Flow Rate (L/min): 2 L/min  IO: Intake/output summary:   Intake/Output Summary (Last 24 hours) at 01-05-19 1534 Last data filed at 2019/01/05 1501 Gross per 24 hour  Intake 1464.35 ml  Output 675 ml  Net 789.35 ml    LBM: Last BM Date: 12/25/18 Baseline Weight: Weight: 63.5 kg Most recent weight: Weight: 63.5 kg     Palliative Assessment/Data: 10%     Time In: 2:50 Time Out: 4:00 Time Total: 70 min Greater than 50%  of this time was spent  counseling and coordinating care related to the above assessment and plan.  Signed by: Asencion Gowda, NP   Please contact Palliative Medicine Team phone at [phone] for questions and concerns.  For individual provider: See Shea Evans

## 2019-01-01 NOTE — Progress Notes (Signed)
CRITICAL CARE PROGRESS NOTE    Name: Zachary Holland MRN: [id number] DOB: 1933/01/01     LOS: 6   SUBJECTIVE FINDINGS & SIGNIFICANT EVENTS   Patient description:   85 yo male with limited medical history including mild cognitive impairment brought in for confusion in setting of febrile illness. Found to have cholestatic transaminitis s/p ERCP w/sphinctertotomy for choledocholithiasis.  Once patient on hospital floor post procedure he was noted to have labored breathing and    Lines / Drains: PIV   Cultures / Sepsis markers: Blood cx ntd  Antibiotics: Narrowed to Zosyn    Protocols / Consultants: GI  Tests / Events: S/p ERCP  Overnight: Sedated with benzo and bennadryl now he is exhibiting worsening altered mentation from underlying Alzheimers dementia.    PAST MEDICAL HISTORY   Past Medical History:  Diagnosis Date  . Dementia [facility])      SURGICAL HISTORY   Past Surgical History:  Procedure Laterality Date  . CHOLECYSTECTOMY    . ERCP N/A 12/16/2018   Procedure: ENDOSCOPIC RETROGRADE CHOLANGIOPANCREATOGRAPHY (ERCP);  Surgeon: Lucilla Lame, MD;  Location: [facility] ENDOSCOPY;  Service: Endoscopy;  Laterality: N/A;     FAMILY HISTORY   History reviewed. No pertinent family history.   SOCIAL HISTORY   Social History   Tobacco Use  . Smoking status: Never Smoker  . Smokeless tobacco: Never Used  Substance Use Topics  . Alcohol use: Not on file  . Drug use: Not on file     MEDICATIONS   Current Medication:  Current Facility-Administered Medications:  .  acetaminophen (TYLENOL) tablet 650 mg, 650 mg, Oral, Q6H PRN, 650 mg at 12/21/2018 1434 **OR** acetaminophen (TYLENOL) suppository 650 mg, 650 mg, Rectal, Q6H PRN, Allen Norris, Darren, MD .  bisacodyl (DULCOLAX) EC tablet 5 mg, 5 mg,  Oral, Daily PRN, Lucilla Lame, MD .  Chlorhexidine Gluconate Cloth 2 % PADS 6 each, 6 each, Topical, Daily, Awilda Bill, NP, 6 each at 12/25/18 1006 .  donepezil (ARICEPT) tablet 10 mg, 10 mg, Oral, QHS, Amreen Raczkowski, MD .  famotidine (PEPCID) tablet 20 mg, 20 mg, Oral, QHS, Charlett Nose, RPH .  feeding supplement (BOOST / RESOURCE BREEZE) liquid 1 Container, 1 Container, Oral, TID BM, Ottie Glazier, MD, 1 Container at 12/25/18 1300 .  fentaNYL (SUBLIMAZE) injection 12.5-25 mcg, 12.5-25 mcg, Intravenous, Q4H PRN, Awilda Bill, NP, 25 mcg at 22-Jan-2019 0541 .  finasteride (PROSCAR) tablet 5 mg, 5 mg, Oral, Daily, Allen Norris, Darren, MD, 5 mg at 12/25/18 1314 .  heparin injection 5,000 Units, 5,000 Units, Subcutaneous, Q8H, Fritzi Mandes, MD, 5,000 Units at 01/22/19 0520 .  ipratropium (ATROVENT) nebulizer solution 0.5 mg, 0.5 mg, Nebulization, Q6H PRN, Lance Coon, MD .  lactated ringers infusion, , Intravenous, Continuous, Awilda Bill, NP, Last Rate: 40 mL/hr at 01/22/2019 0800 .  levalbuterol (XOPENEX) nebulizer solution 1.25 mg, 1.25 mg, Nebulization, Q6H PRN, Lance Coon, MD .  metoprolol tartrate (LOPRESSOR) injection 5 mg, 5 mg, Intravenous, Q4H PRN, Fritzi Mandes, MD, 5 mg at 01/22/19 0147 .  ondansetron (ZOFRAN) tablet 4 mg, 4 mg, Oral, Q6H PRN **OR** ondansetron (ZOFRAN) injection 4 mg, 4 mg, Intravenous, Q6H PRN, Lucilla Lame, MD, 4 mg at 12/24/18 0934 .  polyethylene glycol (MIRALAX / GLYCOLAX) packet 17 g, 17 g, Oral, Daily, Vanga, Tally Due, MD, 17 g at 12/25/18 1702 .  potassium PHOSPHATE 20 mmol in dextrose 5 % 500 mL infusion, 20 mmol, Intravenous, Once, Ottie Glazier, MD .  valproic  acid (DEPAKENE) solution 250 mg, 250 mg, Oral, BID, Karna ChristmasAleskerov, Corian Handley, MD, 250 mg at 12/25/18 0955    ALLERGIES   Tramadol    REVIEW OF SYSTEMS    Patient with underlying dementia 10 point ROS negative   PHYSICAL EXAMINATION   Vital Signs: Temp:  [97.6 F (36.4 C)-99 F  (37.2 C)] 99 F (37.2 C) (09/25 0830) Pulse Rate:  [53-115] 94 (09/25 0830) Resp:  [12-30] 25 (09/25 0830) BP: (101-138)/(69-118) 109/87 (09/25 0830) SpO2:  [76 %-100 %] 96 % (09/25 0830)  GENERAL:confused/demented age apporpriate  HEAD: Normocephalic, atraumatic.  EYES: Pupils equal, round, reactive to light.  No scleral icterus.  MOUTH: Moist mucosal membrane. NECK: Supple. No thyromegaly. No nodules. No JVD.  PULMONARY: bilateral rhonchi and crackles at bases b/l CARDIOVASCULAR: S1 and S2. Regular rate and rhythm. No murmurs, rubs, or gallops.  GASTROINTESTINAL: Soft, nontender, non-distended. No masses. Positive bowel sounds. No hepatosplenomegaly.  MUSCULOSKELETAL: No swelling, clubbing, or edema.  NEUROLOGIC: Mild distress due to acute illness SKIN:intact,warm,dry   PERTINENT DATA     Infusions: . lactated ringers 40 mL/hr at 12/30/2018 0800  . potassium PHOSPHATE IVPB (in mmol)     Scheduled Medications: . Chlorhexidine Gluconate Cloth  6 each Topical Daily  . donepezil  10 mg Oral QHS  . famotidine  20 mg Oral QHS  . feeding supplement  1 Container Oral TID BM  . finasteride  5 mg Oral Daily  . heparin injection (subcutaneous)  5,000 Units Subcutaneous Q8H  . polyethylene glycol  17 g Oral Daily  . valproic acid  250 mg Oral BID   PRN Medications: acetaminophen **OR** acetaminophen, bisacodyl, fentaNYL (SUBLIMAZE) injection, ipratropium, levalbuterol, metoprolol tartrate, ondansetron **OR** ondansetron (ZOFRAN) IV Hemodynamic parameters:   Intake/Output: 09/24 0701 - 09/25 0700 In: 1182 [I.V.:575.5; IV Piggyback:606.5] Out: 650 [Urine:650]  Ventilator  Settings:      LAB RESULTS:  Basic Metabolic Panel: Recent Labs  Lab 12/23/18 0409 12/24/18 0615 12/25/18 0415 12/25/18 2255 12/06/2018 0344  NA 145 146* 143 144 144  K 3.5 4.2 3.7 3.8 3.7  CL 113* 114* 105 109 110  CO2 21* 23 25 25 24   GLUCOSE 130* 129* 114* 95 83  BUN 25* 36* 42* 34* 32*   CREATININE 1.13 1.25* 1.32* 1.11 0.97  CALCIUM 8.0* 8.0* 8.2* 8.1* 8.1*  MG 1.9 2.4  --  2.5* 2.5*  PHOS 2.3* 2.7 1.9*  --  2.4*   Liver Function Tests: Recent Labs  Lab 12/21/18 0445 2018/11/12 0855 12/23/18 0409 12/24/18 0615 12/25/18 0415  AST 163* 80* 68* 44* 37  ALT 173* 110* 79* 53* 48*  ALKPHOS 205* 244* 250* 177* 196*  BILITOT 7.4* 6.5* 7.6* 4.4* 3.3*  PROT 5.5* 6.1* 6.0* 5.3* 5.8*  ALBUMIN 3.1* 3.4* 3.2* 2.7* 2.9*   Recent Labs  Lab 12/21/18 0445 12/23/18 0409  LIPASE 17 12   No results for input(s): AMMONIA in the last 168 hours. CBC: Recent Labs  Lab 12/21/18 0445 2018/11/12 0855 12/23/18 0409 12/25/18 0415 12/06/2018 0344  WBC 8.5 7.5 8.3 7.4 7.2  NEUTROABS  --   --  7.2 6.3  --   HGB 12.8* 13.6 14.3 12.4* 13.3  HCT 39.7 41.7 43.1 38.6* 42.3  MCV 91.1 90.7 88.3 91.5 93.6  PLT 84* 97* 110* 91* 99*   Cardiac Enzymes: No results for input(s): CKTOTAL, CKMB, CKMBINDEX, TROPONINI in the last 168 hours. BNP: Invalid input(s): POCBNP CBG: Recent Labs  Lab 12/21/18 1546 12/21/18 1605 12/23/18 0220  GLUCAP 56* 102* 109*     IMAGING RESULTS:  Imaging: Dg Chest Port 1 View  Result Date: 12/25/2018 CLINICAL DATA:  Acute respiratory failure. EXAM: PORTABLE CHEST 1 VIEW COMPARISON:  Radiograph December 22, 2018. FINDINGS: The heart size and mediastinal contours are within normal limits. Mildly decreased bilateral lung opacities are noted suggesting improving pneumonia. No pneumothorax is noted. Small pleural effusions may be present. The visualized skeletal structures are unremarkable. IMPRESSION: Improved bilateral lung opacities compared to prior exam. Electronically Signed   By: Lupita Raider M.D.   On: 12/25/2018 07:02      ASSESSMENT AND PLAN    -Multidisciplinary rounds held today  Acute Hypoxic Respiratory Failure -bilateral airspace disease worse on right per CXR on MICU admission -poss AECHF - TTE with advanced sysotlic chf -poss  infectious pneumonia despite normal WBC count due to confusion, elevation of PCT and abnormal CXR -c/w empiric zosyn -mental status is improved    Dementia - Alzheimers type - will start Donepezil and Seroquel  - I do not recommend sedation, narcotics, benzos, antipsychotics nor restraints due to known adverse impact on mentation with underlying dementia.  -Sitter in room please  -sister and mother had Alzherimers dementia   Septic encephalopathy -resolved - treating underlying cause -currently thought due to pneumonia vs GI source  - continue with Zosyn  -has 1:1 sitter , mitts on UEx2    Obstructive Uropathy  - continue home proscar -s/p foley catheter - has been making adequate urine   Sepsis  -due to Pneumonia vs GI source  -follow up cultures- ntd -emperic zosyn  -consider stress dose steroids-cortisol in process   ID -continue IV abx as prescibed -follow up cultures   GI/Nutrition GI PROPHYLAXIS as indicated DIET-->TF's as tolerated Constipation protocol as indicated   ENDO - ICU hypoglycemic\Hyperglycemia protocol -check FSBS per protocol   ELECTROLYTES -follow labs as needed -replace as needed -pharmacy consultation   DVT/GI PRX ordered -heparin 5k [place] q8h TRANSFUSIONS AS NEEDED MONITOR FSBS ASSESS the need for LABS as needed    Critical care provider statement:    Critical care time (minutes):  33   Critical care time was exclusive of:  Separately billable procedures and treating other patients   Critical care was necessary to treat or prevent imminent or life-threatening deterioration of the following conditions:  sepsis , pneumonia, choledocholithiasis, encephalopathy   Critical care was time spent personally by me on the following activities:  Development of treatment plan with patient or surrogate, discussions with consultants, evaluation of patient's response to treatment, examination of patient, obtaining history from patient or  surrogate, ordering and performing treatments and interventions, ordering and review of laboratory studies and re-evaluation of patient's condition.  I assumed direction of critical care for this patient from another provider in my specialty: no    This document was prepared using Dragon voice recognition software and may include unintentional dictation errors.    Vida Rigger, M.D.  Division of Pulmonary & Critical Care Medicine  [facility]

## 2019-01-01 NOTE — Consult Note (Addendum)
Reason for Consult:AMS Referring Physician: Sherryll BurgerShah  CC: AMS  HPI: Zachary DavidsonDavid Edward Holland is an 85 y.o. male admitted on 9/19 confused and unresponsive to verbal stimuli.  According to his wife the patient has a history of dementia and he got gallbladder surgery this February.  He started to have confusion on the day prior to admission.  He was febrile at 102.2 with heart rate of 105.  Chest x-ray report right upper pneumonia.  Patient has been treated for PNA and was improving cogntively as well but started to exhibit agitation and confusion at night.  Has received multiple doses of Fentanyl, Ativan and Benadryl which only appear to be exacerbating his issues.  Was started on Seroquel but developed prolonged QT and this had to be discontinued.      Past Medical History:  Diagnosis Date  . Dementia [facility](HCC)     Past Surgical History:  Procedure Laterality Date  . CHOLECYSTECTOMY    . ERCP N/A 12/12/2018   Procedure: ENDOSCOPIC RETROGRADE CHOLANGIOPANCREATOGRAPHY (ERCP);  Surgeon: Midge MiniumWohl, Darren, MD;  Location[facility] ENDOSCOPY;  Service: Endoscopy;  Laterality: N/A;    Family history: Unable to provide due to mental status  Social History:  reports that he has never smoked. He has never used smokeless tobacco. No history on file for alcohol and drug.  Allergies  Allergen Reactions  . Tramadol Other (See Comments)    Altered mental status     Medications:  I have reviewed the patient's current medications. Prior to Admission:  Medications Prior to Admission  Medication Sig Dispense Refill Last Dose  . cholecalciferol (VITAMIN D3) 25 MCG (1000 UT) tablet Take 1,000 Units by mouth daily.   12/19/2018 at Unknown time  . finasteride (PROSCAR) 5 MG tablet Take 5 mg by mouth daily.   12/19/2018 at Unknown time  . montelukast (SINGULAIR) 10 MG tablet Take 10 mg by mouth daily.   12/19/2018 at Unknown time  . oxyCODONE (OXY IR/ROXICODONE) 5 MG immediate release tablet Take 5 mg by mouth 2 (two) times daily  as needed for moderate pain or severe pain.   Unknown at PRN  . pregabalin (LYRICA) 100 MG capsule Take 100 mg by mouth 2 (two) times daily.   12/19/2018 at 2000  . vitamin B-12 (CYANOCOBALAMIN) 1000 MCG tablet Take 1,000 mcg by mouth daily.   12/19/2018 at Unknown time   Scheduled: . OLANZapine zydis  2.5 mg Oral QHS  . valproic acid  250 mg Oral BID    ROS: Unable to provide due to mental status  Physical Examination: Blood pressure (!) 132/98, pulse (!) 118, temperature 97.7 F (36.5 C), temperature source Axillary, resp. rate (!) 44, height 5\' 10"  (1.778 m), weight 63.5 kg, SpO2 90 %.  HEENT-  Normocephalic, no lesions, without obvious abnormality.  Normal external eye and conjunctiva.  Normal TM's bilaterally.  Normal auditory canals and external ears. Normal external nose, mucus membranes and septum.  Normal pharynx. Cardiovascular- S1, S2 normal, pulses palpable throughout   Lungs- chest clear, no wheezing, rales, normal symmetric air entry Abdomen- soft, non-tender; bowel sounds normal; no masses,  no organomegaly Extremities- no edema Lymph-no adenopathy palpable Musculoskeletal-no joint tenderness, deformity or swelling Skin-warm and dry, no hyperpigmentation, vitiligo, or suspicious lesions  Neurological Examination  (s/p Fentanyl) Mental Status: Lethargic.  Does not follow commands.  Says one to two words. Cranial Nerves: II: Discs flat bilaterally; Blinks to bilateral confrontation.  Pupils equal, round, reactive to light and accommodation III,IV, VI: intact oculocephalic maneuver  V,VII: intact coerneals bilaterally VIII: unable to test IX,X: unable to test XI: unable to test XII: unable to test Motor: Moves all extremities against gravity.  No focal weakness appreciated Sensory: Responds to noxious stimuli in all extremities Deep Tendon Reflexes: Symmetric throughout Plantars: Right: mute   Left: mute Cerebellar: unable to test Gait: unable to test due to  safety concerns   Laboratory Studies:   Basic Metabolic Panel: Recent Labs  Lab 12/23/18 0409 12/24/18 0615 12/25/18 0415 12/25/18 2255 01-01-19 0344  NA 145 146* 143 144 144  K 3.5 4.2 3.7 3.8 3.7  CL 113* 114* 105 109 110  CO2 21* 23 25 25 24   GLUCOSE 130* 129* 114* 95 83  BUN 25* 36* 42* 34* 32*  CREATININE 1.13 1.25* 1.32* 1.11 0.97  CALCIUM 8.0* 8.0* 8.2* 8.1* 8.1*  MG 1.9 2.4  --  2.5* 2.5*  PHOS 2.3* 2.7 1.9*  --  2.4*    Liver Function Tests: Recent Labs  Lab 12/03/2018 0855 12/23/18 0409 12/24/18 0615 12/25/18 0415 2019-01-01 0038  AST 80* 68* 44* 37 34  ALT 110* 79* 53* 48* 42  ALKPHOS 244* 250* 177* 196* 175*  BILITOT 6.5* 7.6* 4.4* 3.3* 2.9*  PROT 6.1* 6.0* 5.3* 5.8* 5.9*  ALBUMIN 3.4* 3.2* 2.7* 2.9* 3.1*   Recent Labs  Lab 12/21/18 0445 12/23/18 0409  LIPASE 17 12   No results for input(s): AMMONIA in the last 168 hours.  CBC: Recent Labs  Lab 12/21/18 0445 12/08/2018 0855 12/23/18 0409 12/25/18 0415 Jan 01, 2019 0344  WBC 8.5 7.5 8.3 7.4 7.2  NEUTROABS  --   --  7.2 6.3  --   HGB 12.8* 13.6 14.3 12.4* 13.3  HCT 39.7 41.7 43.1 38.6* 42.3  MCV 91.1 90.7 88.3 91.5 93.6  PLT 84* 97* 110* 91* 99*    Cardiac Enzymes: No results for input(s): CKTOTAL, CKMB, CKMBINDEX, TROPONINI in the last 168 hours.  BNP: Invalid input(s): POCBNP  CBG: Recent Labs  Lab 12/21/18 1546 12/21/18 1605 12/23/18 0220  GLUCAP 56* 102* 109*    Microbiology: Results for orders placed or performed during the hospital encounter of 12/14/2018  SARS Coronavirus 2 [facility] order, Performed in [facility] hospital lab) Nasopharyngeal Nasopharyngeal Swab     Status: None   Collection Time: 12/15/2018  8:33 AM   Specimen: Nasopharyngeal Swab  Result Value Ref Range Status   SARS Coronavirus 2 NEGATIVE NEGATIVE Final    Comment: (NOTE) If result is NEGATIVE SARS-CoV-2 target nucleic acids are NOT DETECTED. The SARS-CoV-2 RNA is generally detectable in upper and lower   respiratory specimens during the acute phase of infection. The lowest  concentration of SARS-CoV-2 viral copies this assay can detect is 250  copies / mL. A negative result does not preclude SARS-CoV-2 infection  and should not be used as the sole basis for treatment or other  patient management decisions.  A negative result may occur with  improper specimen collection / handling, submission of specimen other  than nasopharyngeal swab, presence of viral mutation(s) within the  areas targeted by this assay, and inadequate number of viral copies  (<250 copies / mL). A negative result must be combined with clinical  observations, patient history, and epidemiological information. If result is POSITIVE SARS-CoV-2 target nucleic acids are DETECTED. The SARS-CoV-2 RNA is generally detectable in upper and lower  respiratory specimens dur ing the acute phase of infection.  Positive  results are indicative of active infection with SARS-CoV-2.  Clinical  correlation with patient history and other diagnostic information is  necessary to determine patient infection status.  Positive results do  not rule out bacterial infection or co-infection with other viruses. If result is PRESUMPTIVE POSTIVE SARS-CoV-2 nucleic acids MAY BE PRESENT.   A presumptive positive result was obtained on the submitted specimen  and confirmed on repeat testing.  While 2019 novel coronavirus  (SARS-CoV-2) nucleic acids may be present in the submitted sample  additional confirmatory testing may be necessary for epidemiological  and / or clinical management purposes  to differentiate between  SARS-CoV-2 and other Sarbecovirus currently known to infect humans.  If clinically indicated additional testing with an alternate test  methodology [phone]) is advised. The SARS-CoV-2 RNA is generally  detectable in upper and lower respiratory sp ecimens during the acute  phase of infection. The expected result is Negative. Fact  Sheet for Patients:  BoilerBrush.com.cy Fact Sheet for Healthcare Providers: https://pope.com/ This test is not yet approved or cleared by the [place] FDA and has been authorized for detection and/or diagnosis of SARS-CoV-2 by FDA under an Emergency Use Authorization (EUA).  This EUA will remain in effect (meaning this test can be used) for the duration of the COVID-19 declaration under Section 564(b)(1) of the Act, 21 U.S.C. section 360bbb-3(b)(1), unless the authorization is terminated or revoked sooner. Performed at [facility], [address], [place], [place] [address]   Urine culture     Status: None   Collection Time: 12/09/2018  8:48 AM   Specimen: In/Out Cath Urine  Result Value Ref Range Status   Specimen Description   Final    IN/OUT CATH URINE Performed at [facility], [address], [place], [place] [address]    Special Requests   Final    NONE Performed at [facility], [address], [place], [place] [address]    Culture   Final    NO GROWTH Performed at [facility], [address] [place], [place] [address]    Report Status 12/21/2018 FINAL  Final  Blood Culture (routine x 2)     Status: None   Collection Time: 12/09/2018  8:57 AM   Specimen: BLOOD  Result Value Ref Range Status   Specimen Description BLOOD L ARM  Final   Special Requests   Final    BOTTLES DRAWN AEROBIC AND ANAEROBIC Blood Culture adequate volume   Culture   Final    NO GROWTH 5 DAYS Performed at [facility], [address], [place], [place] [address]    Report Status 12/25/2018 FINAL  Final  Blood Culture (routine x 2)     Status: None   Collection Time: 12/19/2018  8:58 AM   Specimen: BLOOD  Result Value Ref Range Status   Specimen Description BLOOD RAC  Final   Special Requests   Final    BOTTLES DRAWN AEROBIC AND ANAEROBIC Blood Culture adequate volume   Culture   Final    NO  GROWTH 5 DAYS Performed at [facility], [address] [place], [place] [address]    Report Status 12/25/2018 FINAL  Final  MRSA PCR Screening     Status: None   Collection Time: 12/23/18  9:45 AM   Specimen: Nasal Mucosa; Nasopharyngeal  Result Value Ref Range Status   MRSA by PCR NEGATIVE NEGATIVE Final    Comment:        The GeneXpert MRSA Assay (FDA approved for NASAL specimens only), is one component  of a comprehensive MRSA colonization surveillance program. It is not intended to diagnose MRSA infection nor to guide or monitor treatment for MRSA infections. Performed at [facility], [address] [place], [place] [address]     Coagulation Studies: No results for input(s): LABPROT, INR in the last 72 hours.  Urinalysis:  Recent Labs  Lab 12/19/2018 0848  COLORURINE YELLOW*  LABSPEC 1.009  PHURINE 8.0  GLUCOSEU NEGATIVE  HGBUR NEGATIVE  BILIRUBINUR NEGATIVE  KETONESUR NEGATIVE  PROTEINUR NEGATIVE  NITRITE NEGATIVE  LEUKOCYTESUR NEGATIVE    Lipid Panel:  No results found for: CHOL, TRIG, HDL, CHOLHDL, VLDL, LDLCALC  HgbA1C: No results found for: HGBA1C  Urine Drug Screen:  No results found for: LABOPIA, COCAINSCRNUR, LABBENZ, AMPHETMU, THCU, LABBARB  Alcohol Level: No results for input(s): ETH in the last 168 hours.  Other results: EKG: atrial fibrillation at 89 bpm.  Imaging: Dg Chest Port 1 View  Result Date: 12/25/2018 CLINICAL DATA:  Acute respiratory failure. EXAM: PORTABLE CHEST 1 VIEW COMPARISON:  Radiograph December 22, 2018. FINDINGS: The heart size and mediastinal contours are within normal limits. Mildly decreased bilateral lung opacities are noted suggesting improving pneumonia. No pneumothorax is noted. Small pleural effusions may be present. The visualized skeletal structures are unremarkable. IMPRESSION: Improved bilateral lung opacities compared to prior exam. Electronically Signed   By: Lupita Raider M.D.   On:  12/25/2018 07:02     Assessment/Plan: 85 year old male with history of dementia admitted with PNA and multiple metabolic abnormalities.  Patient has had some cognitive improvement as well but remains altered and appears to be experiencing sundowning.  No focal neurological deficits appreciated.  As expected patient has not had a good response to Fentanyl, Ativan and Benadryl.  May do better with a antipsychotic but did develop QT prolongation when Seroquel was initiated.    Recommendations: 1. Will initiate Zyprexa with a standing nighttime dose and prn dosing availability as well. 2. TSH, B1 level 3. Based on patient's level of cooperation would perform brain imaging with either head CT of MRI of the brain without contrast since patient noted to have some atrial fibrillation.   4. Would continue with behavioral interventions to include keeping the room lit well during the day and frequent orientation of the patient.  Due to patient's acute illness and underlying dementia cognitive improvement will lag behind improvement in lab work.     Thana Farr, MD Neurology [phone] 12/15/2018, 4:33 PM

## 2019-01-01 NOTE — Progress Notes (Signed)
PT Cancellation Note  Patient Details Name: Sanchez Hemmer MRN: [id number] DOB: [birth year]   Cancelled Treatment:    Reason Eval/Treat Not Completed: Medical issues which prohibited therapy(Per chart review, patient noted with episode of bradycardia overnight, associated EKG changes/elevated troponin.  Cardio consult pending.  Will hold at this time and re-attempt at later time/date as medically appropriate and available.)   Oriah Leinweber H. Owens Shark, PT, DPT, NCS 12/28/2018, 11:28 AM [phone]

## 2019-01-01 NOTE — Progress Notes (Signed)
[facility] - [place] at [facility]   PATIENT NAME: Zachary Holland    MR#:  [id number]  DATE OF BIRTH:  Jul 08, 1932  SUBJECTIVE:  patient has dementia. Poor historian. Status post ERCP. Patient developed significant respiratory distress became hypoxic was transferred to the ICU. remains sleepy  had brief episode of bradycardia (HR upper 20's-30's) last eve, REVIEW OF SYSTEMS:   Review of Systems  Unable to perform ROS: Dementia   Tolerating Diet:npo Tolerating PT: pending  DRUG ALLERGIES:   Allergies  Allergen Reactions  . Tramadol Other (See Comments)    Altered mental status     VITALS:  Blood pressure (!) 132/98, pulse (!) 118, temperature 97.7 F (36.5 C), temperature source Axillary, resp. rate (!) 44, height 5\' 10"  (1.778 m), weight 63.5 kg, SpO2 90 %.  PHYSICAL EXAMINATION:   Physical Exam  GENERAL:  85 y.o.-year-old patient lying in the bed with no acute distress. Thin, cachectic+ EYES: Pupils equal, round, reactive to light and accommodation. No scleral icterus.  HEENT: Head atraumatic, normocephalic. Oropharynx and nasopharynx clear.  NECK:  Supple, no jugular venous distention. No thyroid enlargement, no tenderness.  LUNGS:distant  breath sounds bilaterally, no wheezing, rales, rhonchi. No use of accessory muscles of respiration.  CARDIOVASCULAR: S1, S2 normal. No murmurs, rubs, or gallops.  ABDOMEN: Soft, nontender, nondistended. Bowel sounds present. No organomegaly or mass.  EXTREMITIES: No cyanosis, clubbing or edema b/l.    NEUROLOGIC:unable to assess but grossly non focal PSYCHIATRIC:  patient is sleepy, demented SKIN: No obvious rash, lesion, or ulcer LABORATORY PANEL:  CBC Recent Labs  Lab 12/14/2018 0344  WBC 7.2  HGB 13.3  HCT 42.3  PLT 99*    Chemistries  Recent Labs  Lab 12/11/2018 0038 12/18/2018 0344  NA  --  144  K  --  3.7  CL  --  110  CO2  --  24  GLUCOSE  --  83  BUN  --  32*  CREATININE  --  0.97   CALCIUM  --  8.1*  MG  --  2.5*  AST 34  --   ALT 42  --   ALKPHOS 175*  --   BILITOT 2.9*  --    Cardiac Enzymes No results for input(s): TROPONINI in the last 168 hours. RADIOLOGY:  Dg Chest Port 1 View  Result Date: 12/25/2018 CLINICAL DATA:  Acute respiratory failure. EXAM: PORTABLE CHEST 1 VIEW COMPARISON:  Radiograph December 22, 2018. FINDINGS: The heart size and mediastinal contours are within normal limits. Mildly decreased bilateral lung opacities are noted suggesting improving pneumonia. No pneumothorax is noted. Small pleural effusions may be present. The visualized skeletal structures are unremarkable. IMPRESSION: Improved bilateral lung opacities compared to prior exam. Electronically Signed   By: December 24, 2018 M.D.   On: 12/25/2018 07:02   ASSESSMENT AND PLAN:  Zachary Holland is a 85 y.o. male with history of mild dementia, status post cholecystectomy in 05/2018 who is admitted yesterday secondary to altered mental status compared to his baseline according to patient's wife.   1. Acute hypoxic respiratory failure secondary to multifocal pneumonia with aspiration - may be underlying CHF as well - Continue zosyn  2. Choledocholithiasis with possible ascending cholangitis -IV fluids & zosyn -G.I. recommends monitoring -Elevated LFTs with direct bilirubin--obstructive etio--/Stone CBD -MRCP shows possible stone in the common bile duct -s/p ERCP with stone removal from CBD  3. Mild elevated troponin in the setting of right upper quadrant pain -no  cardiac history. -Dr. Ubaldo Glassing input noted  4.Dementia at baseline -resume Donepezil and Seroquel   palliative care consultation.  5. Severe Malnutrition with h/o dementia -Dietitian to see   I highly recommend Hospice/comfort care. Very poor prognosis.   Case discussed with Care Management/Social Worker. Management plans discussed with the patient, nursing and they are in agreement.  CODE STATUS: DNR    TOTAL  TIME TAKING CARE OF THIS PATIENT: 15 minutes.  >50% time spent on counselling and coordination of care  POSSIBLE D/C IN * few DAYS, DEPENDING ON CLINICAL CONDITION.  Note: This dictation was prepared with Dragon dictation along with smaller phrase technology. Any transcriptional errors that result from this process are unintentional.  Max Sane M.D on 12/31/2018 at 2:17 PM  Between 7am to 6pm - Pager - [phone]  After 6pm go to [email or web address] - Proofreader  [facility]  Office  [phone]  CC: Primary care physician; Administration, VeteransPatient ID: Zachary Holland, male   DOB: 09/05/1932, 85 y.o.   MRN: [id number]

## 2019-01-01 DEATH — deceased

## 2021-03-08 IMAGING — US US ABDOMEN LIMITED
1 series · 14 of 25 positions shown · non-contrast
Comparison: None.

CLINICAL DATA: Elevated liver function test.

EXAM:
ULTRASOUND ABDOMEN LIMITED RIGHT UPPER QUADRANT

[Series 1: us abdomen limited · 14 of 30 slices shown]
[im 1/30]
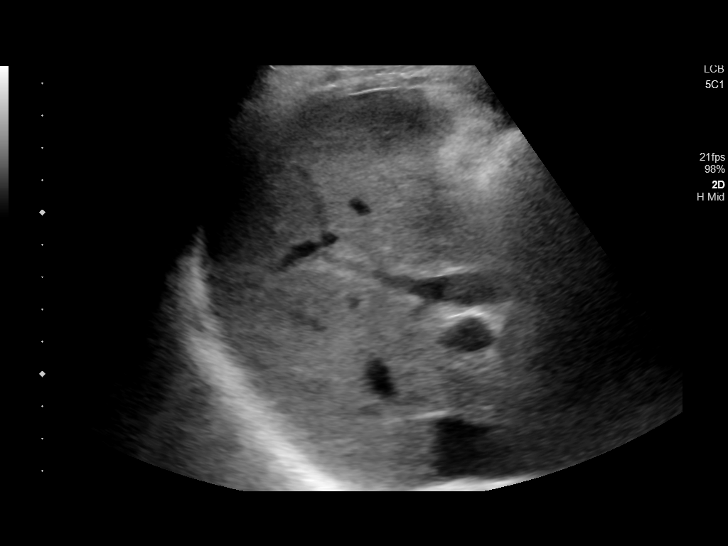
[im 3/30]
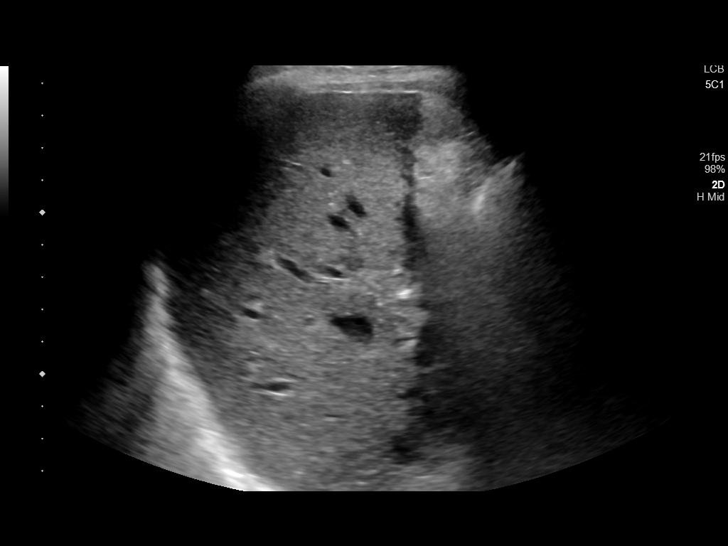
[im 5/30]
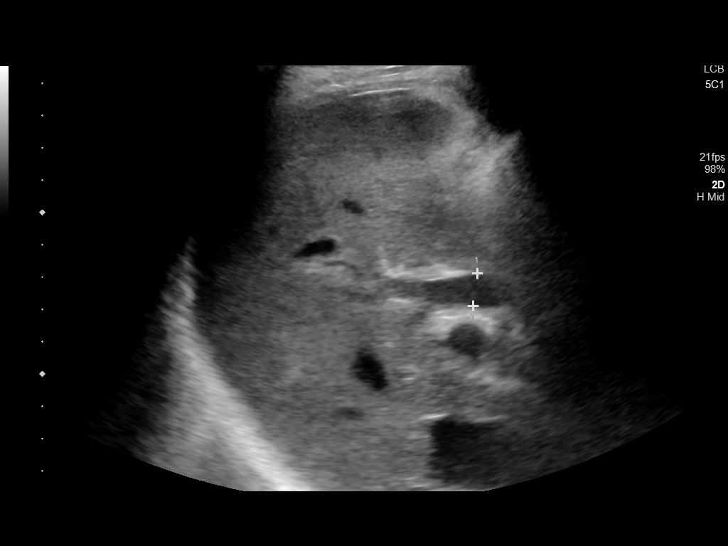
[im 8/30]
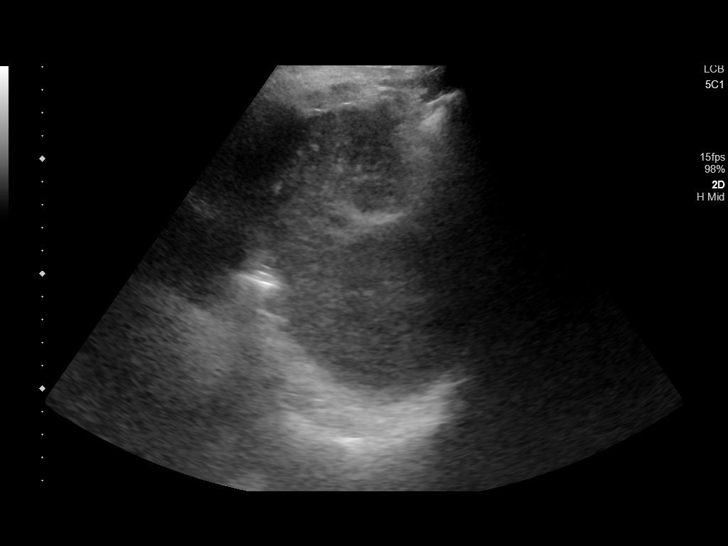
[im 10/30]
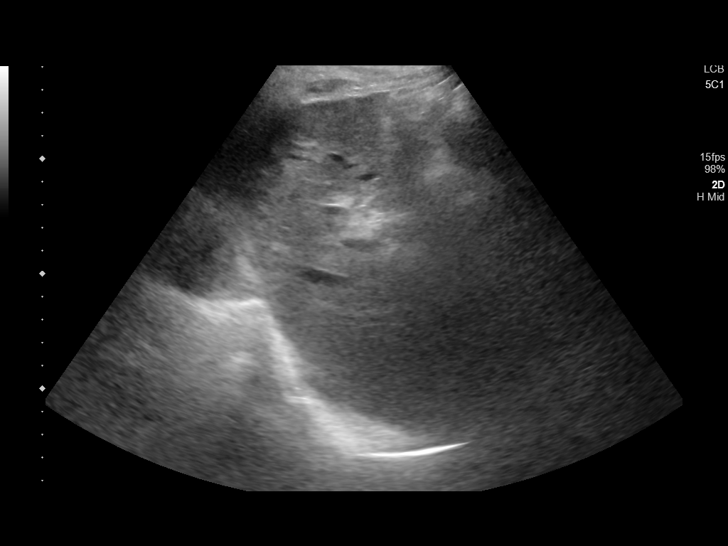
[im 11/30]
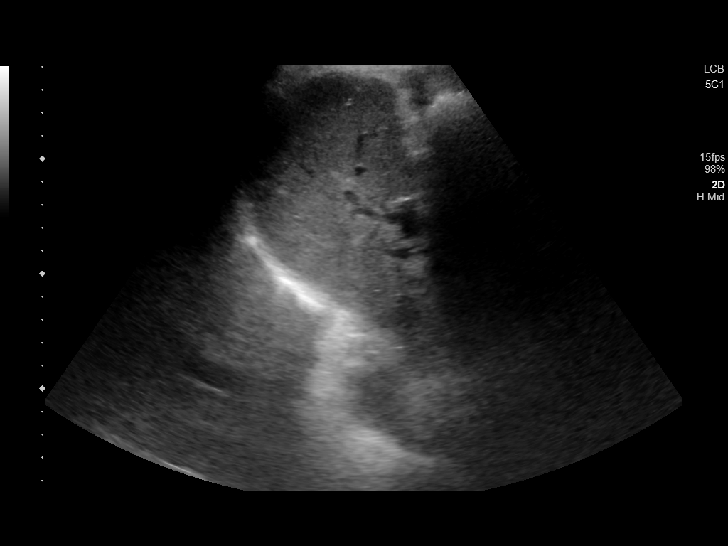
[im 14/30]
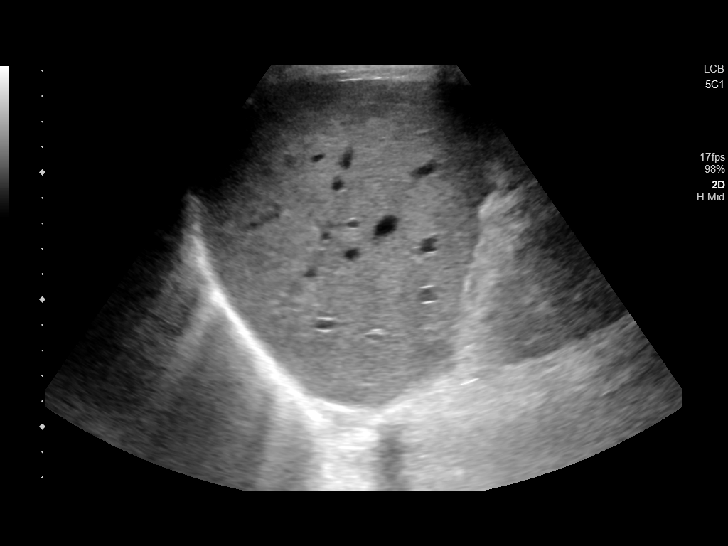
[im 16/30]
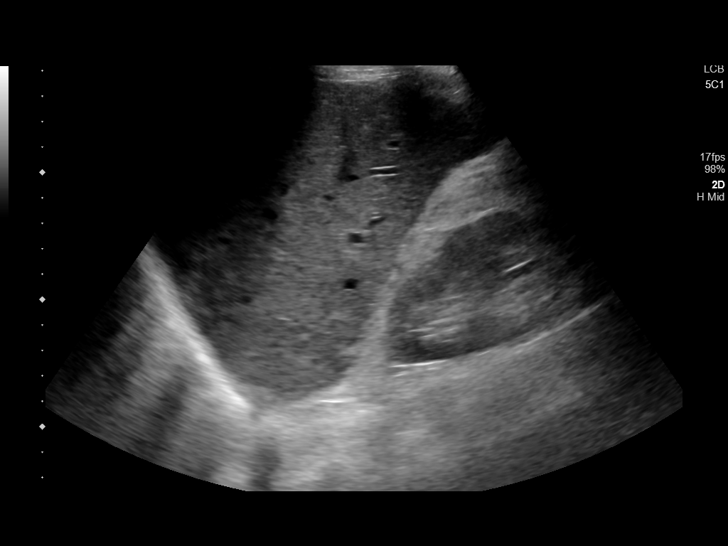
[im 19/30]
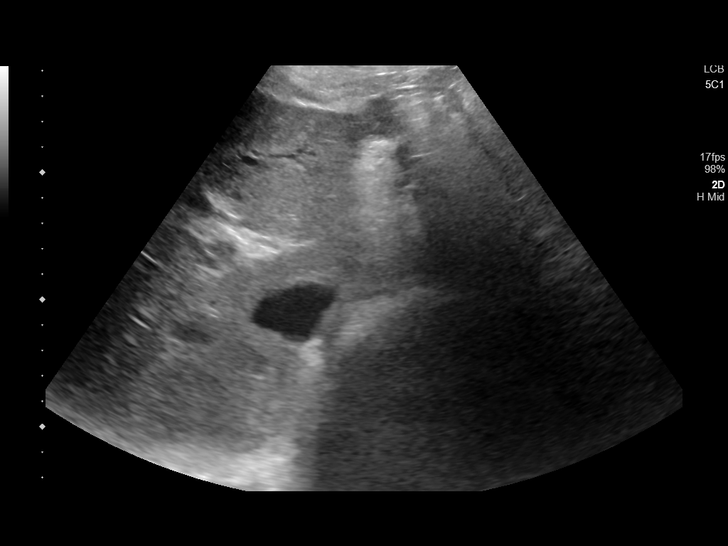
[im 20/30]
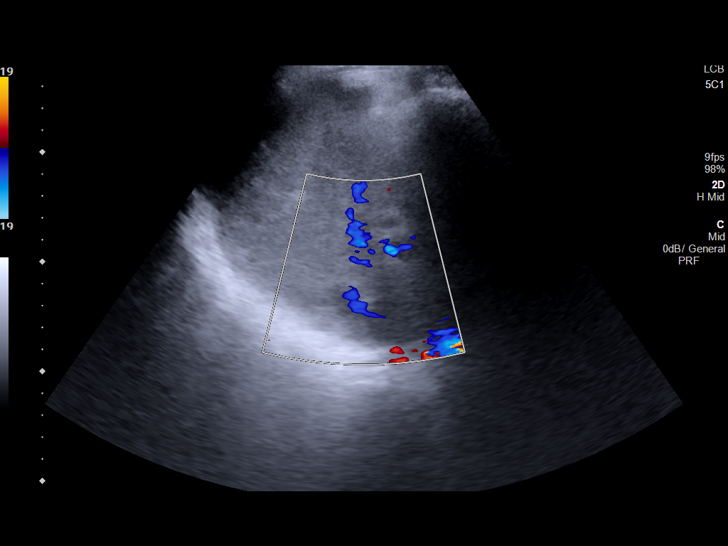
[im 22/30]
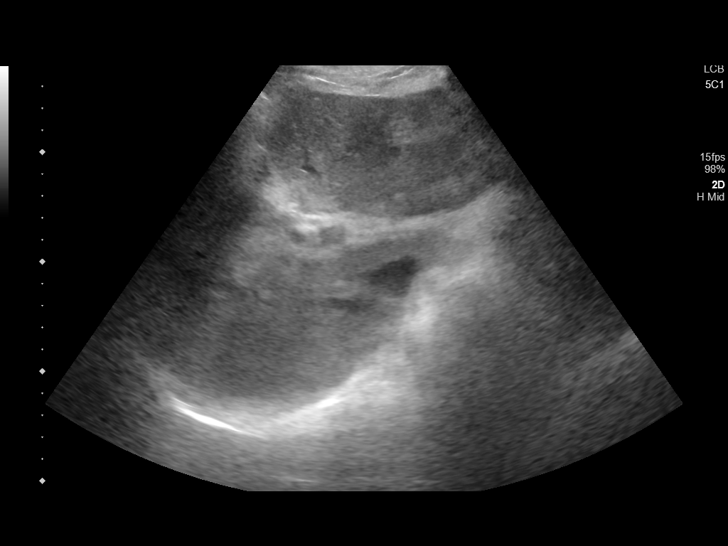
[im 25/30]
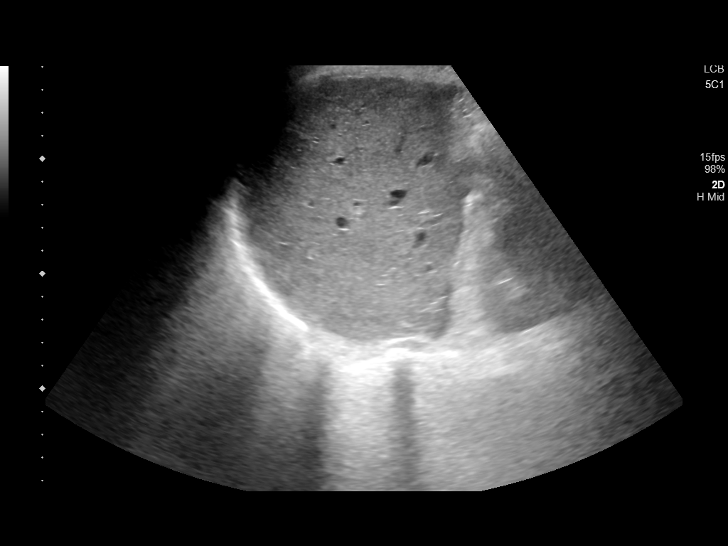
[im 27/30]
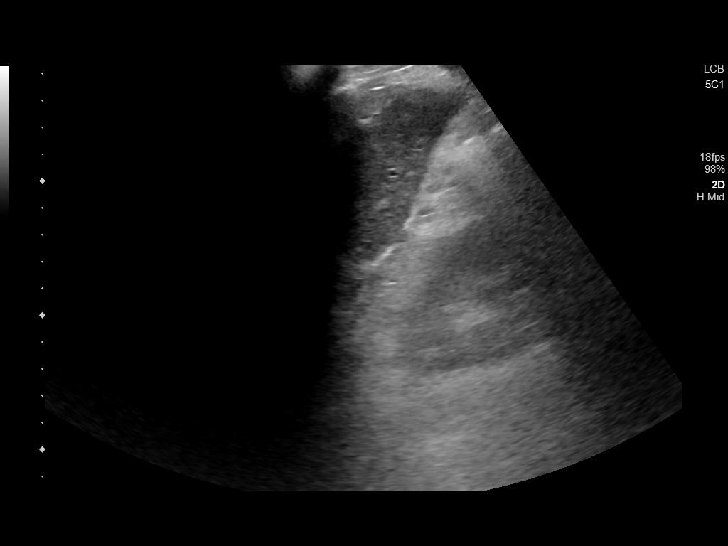
[im 30/30]
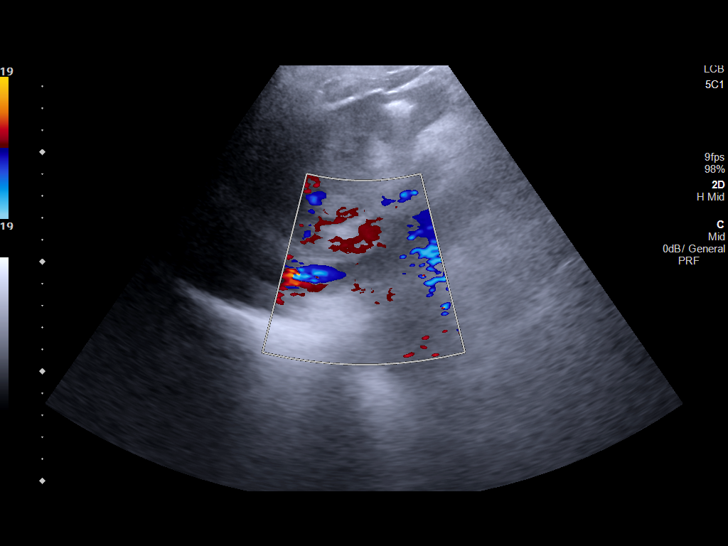

[14 of 25 positions shown; findings below may reference images not displayed]

FINDINGS: Gallbladder:

Surgically absent.

Common bile duct:

Diameter: 10 mm

Liver:

Increased parenchymal echogenicity. No liver mass or focal lesion.
Portal vein is patent on color Doppler imaging with normal direction
of blood flow towards the liver.

Other: None.
IMPRESSION: 1. No acute findings.
2. Increased liver parenchymal echogenicity consistent with hepatic
steatosis.
3. Chronic common bile duct dilation.  Status post cholecystectomy.

## 2021-03-10 IMAGING — DX DG CHEST 1V PORT
2 series · 2 of 2 positions shown · non-contrast
Comparison: 12/20/2018

CLINICAL DATA: Cough

EXAM:
PORTABLE CHEST 1 VIEW

[chest ap (1 of 2)]
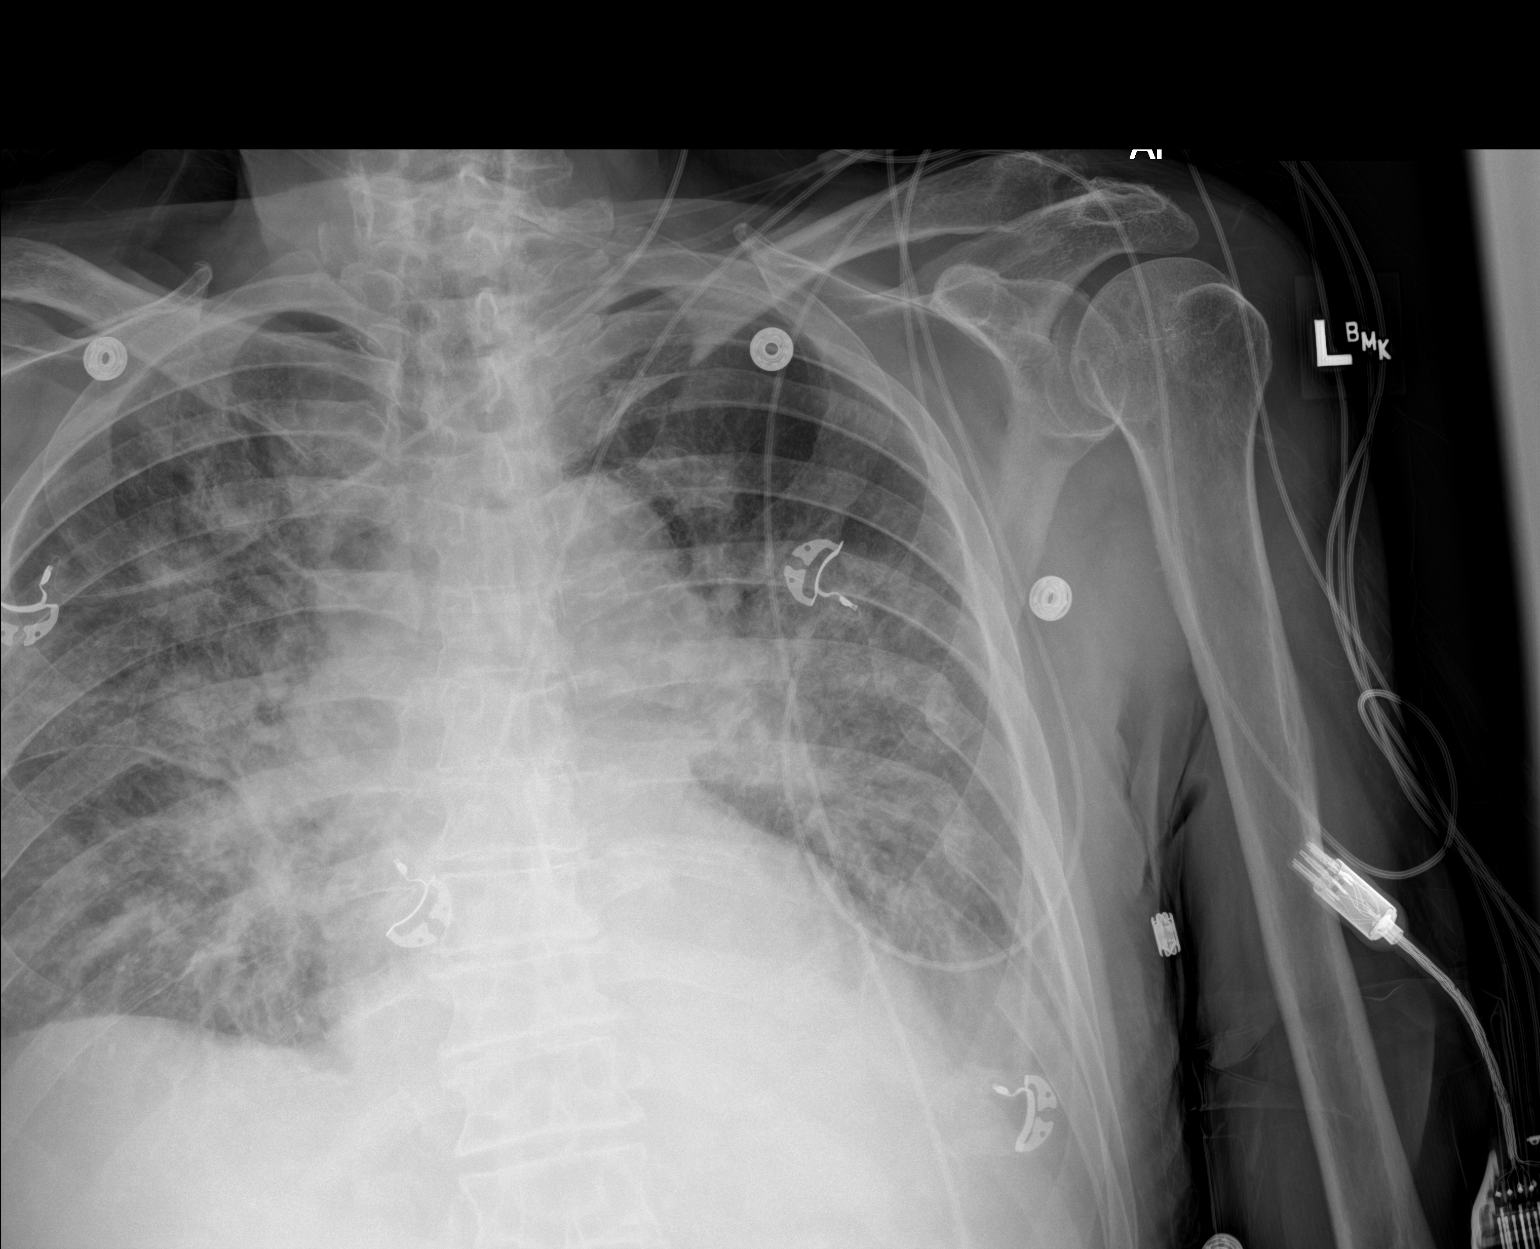

[chest ap (2 of 2)]
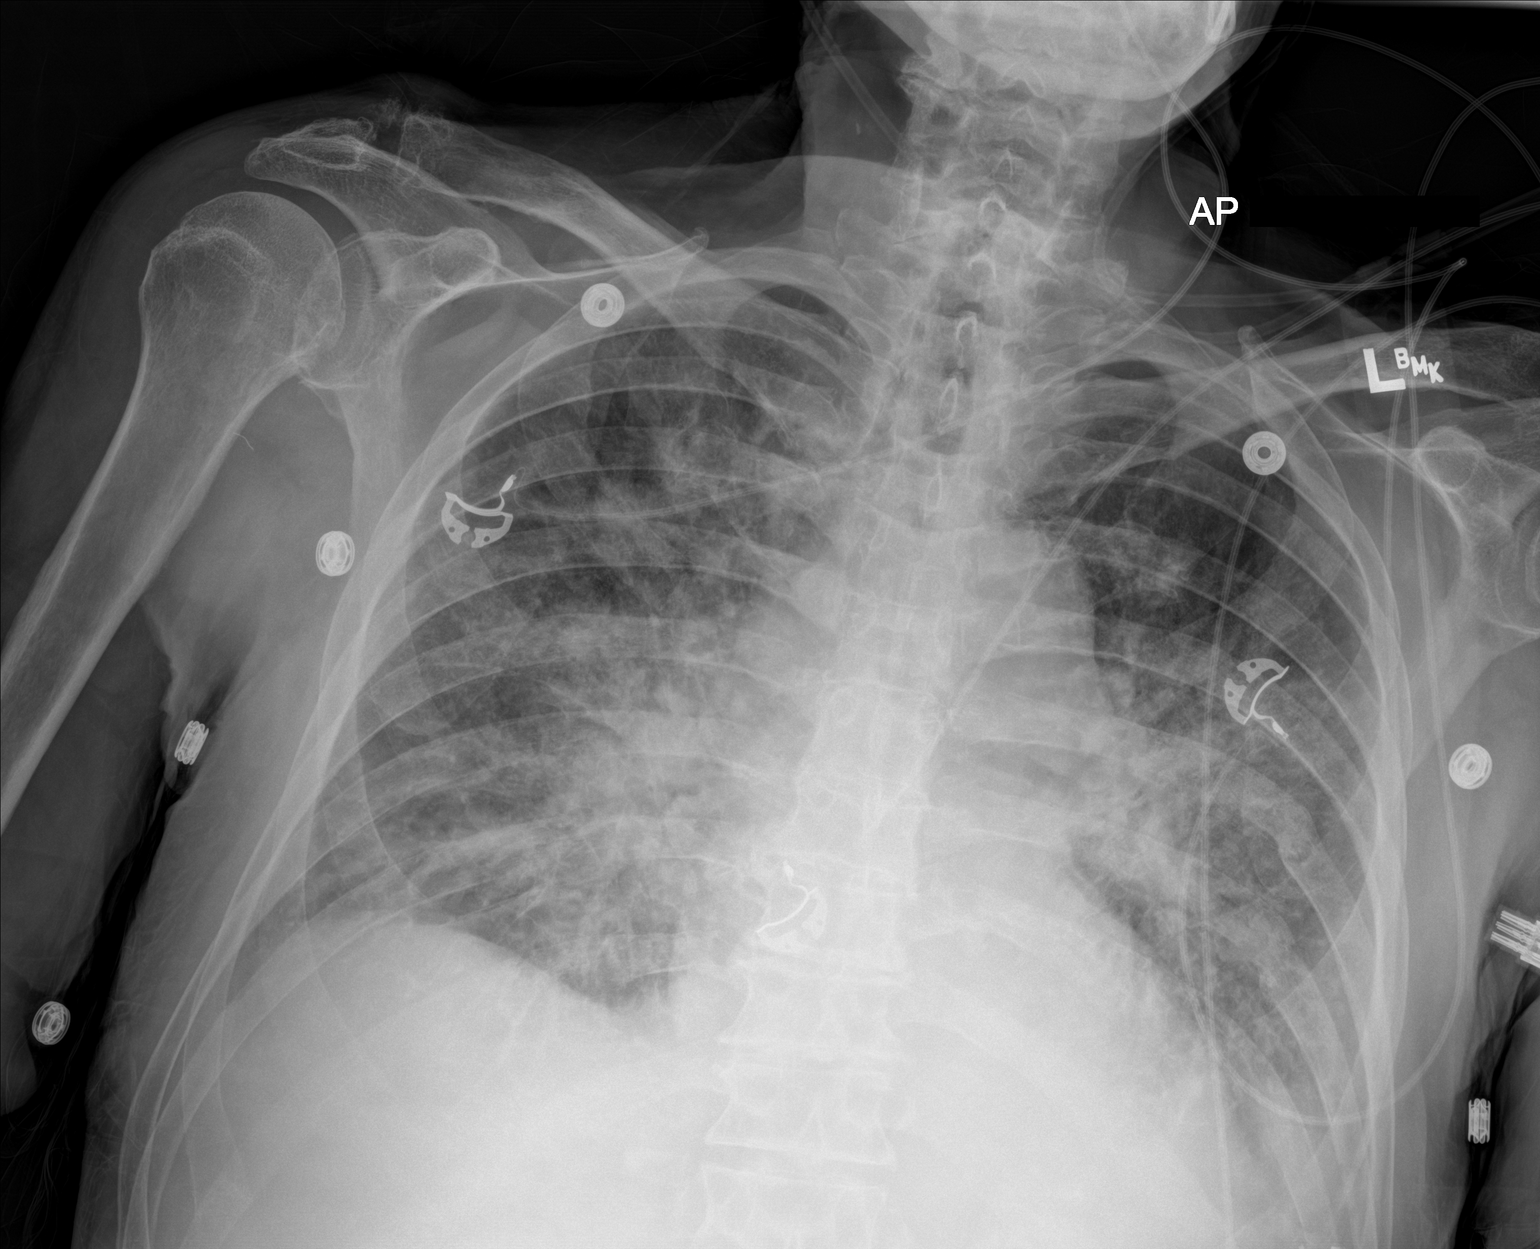

[2 of 2 positions shown; findings below may reference images not displayed]

FINDINGS: Heart is normal size. Diffuse bilateral airspace disease. Possible
small bilateral effusions. No acute bony abnormality. Old healed
left rib fractures.
IMPRESSION: Diffuse bilateral airspace disease with normal heart size. Findings
concerning for pneumonia.

Possible small bilateral effusions.

## 2021-03-13 IMAGING — DX DG CHEST 1V PORT
2 series · 2 of 2 positions shown · non-contrast
Comparison: Radiograph December 22, 2018.

CLINICAL DATA: Acute respiratory failure.

EXAM:
PORTABLE CHEST 1 VIEW

[chest ap (1 of 2)]
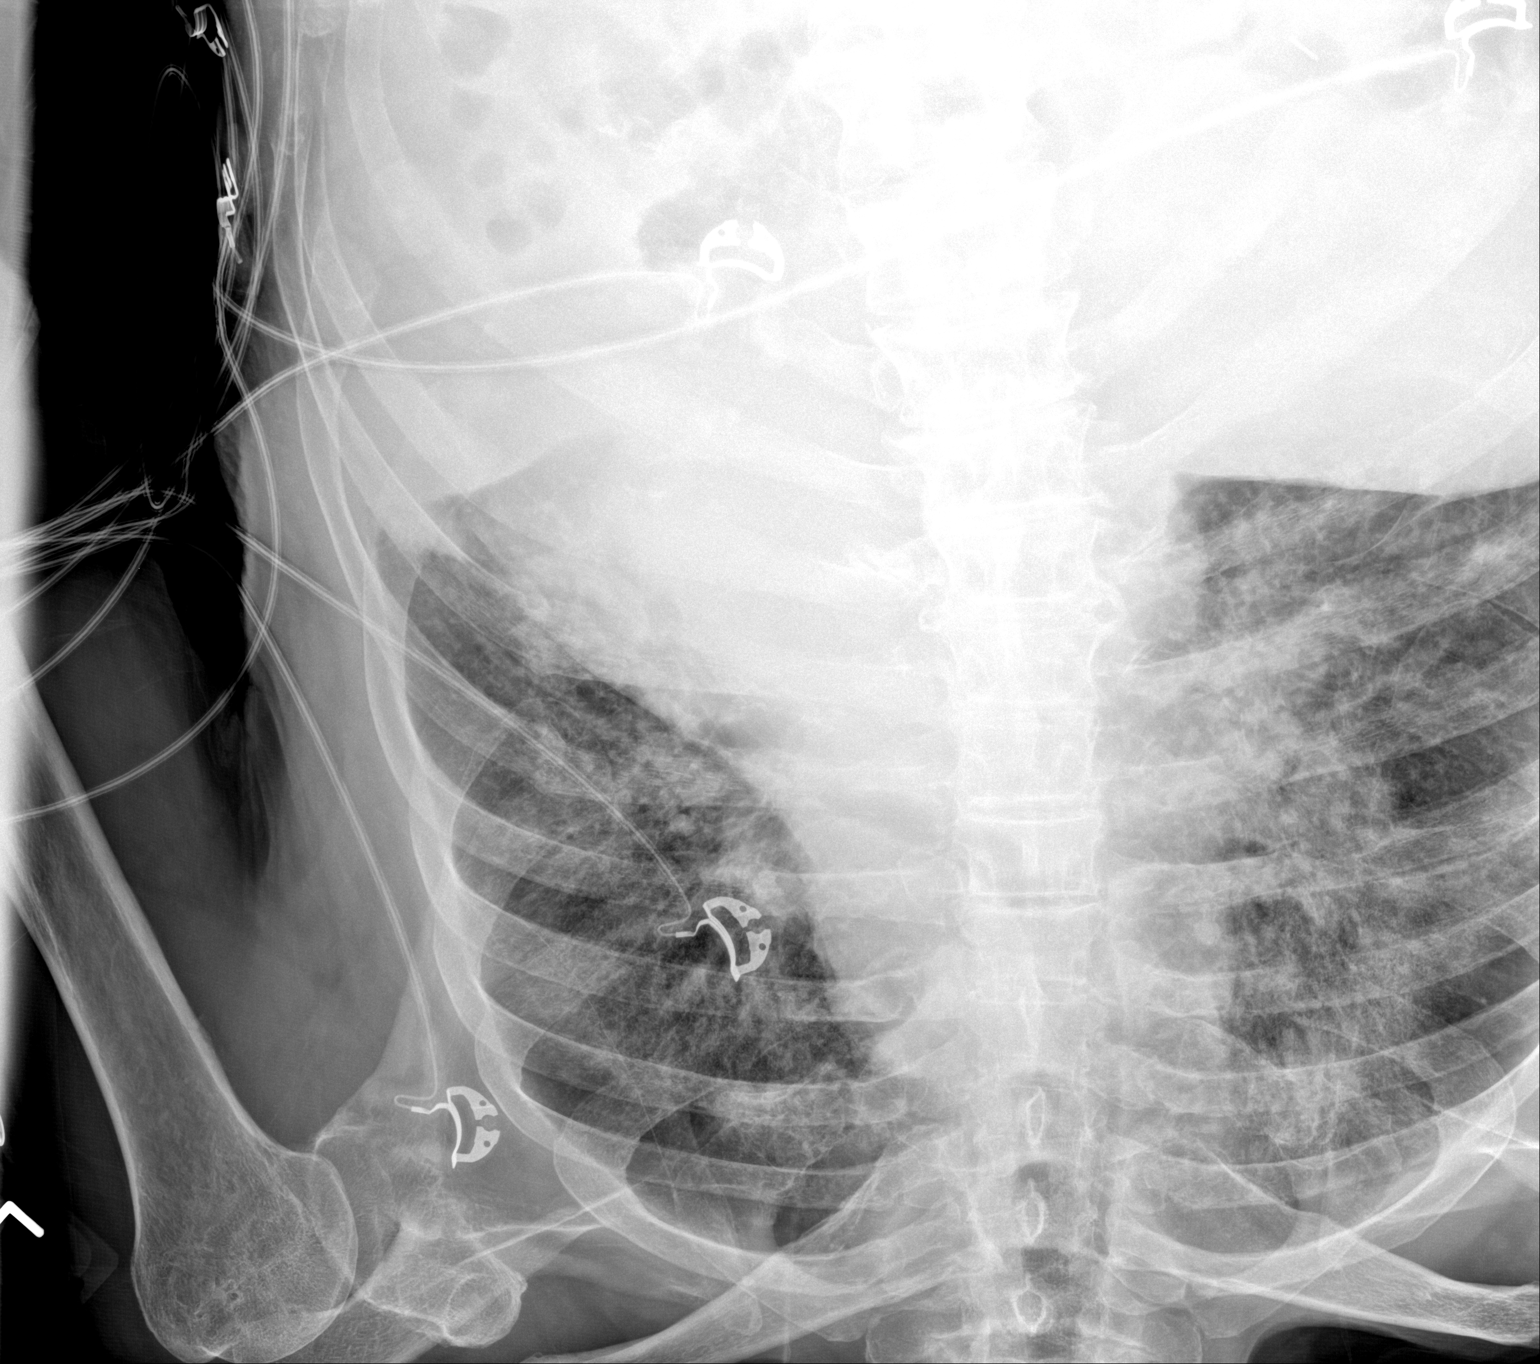

[chest ap (2 of 2)]
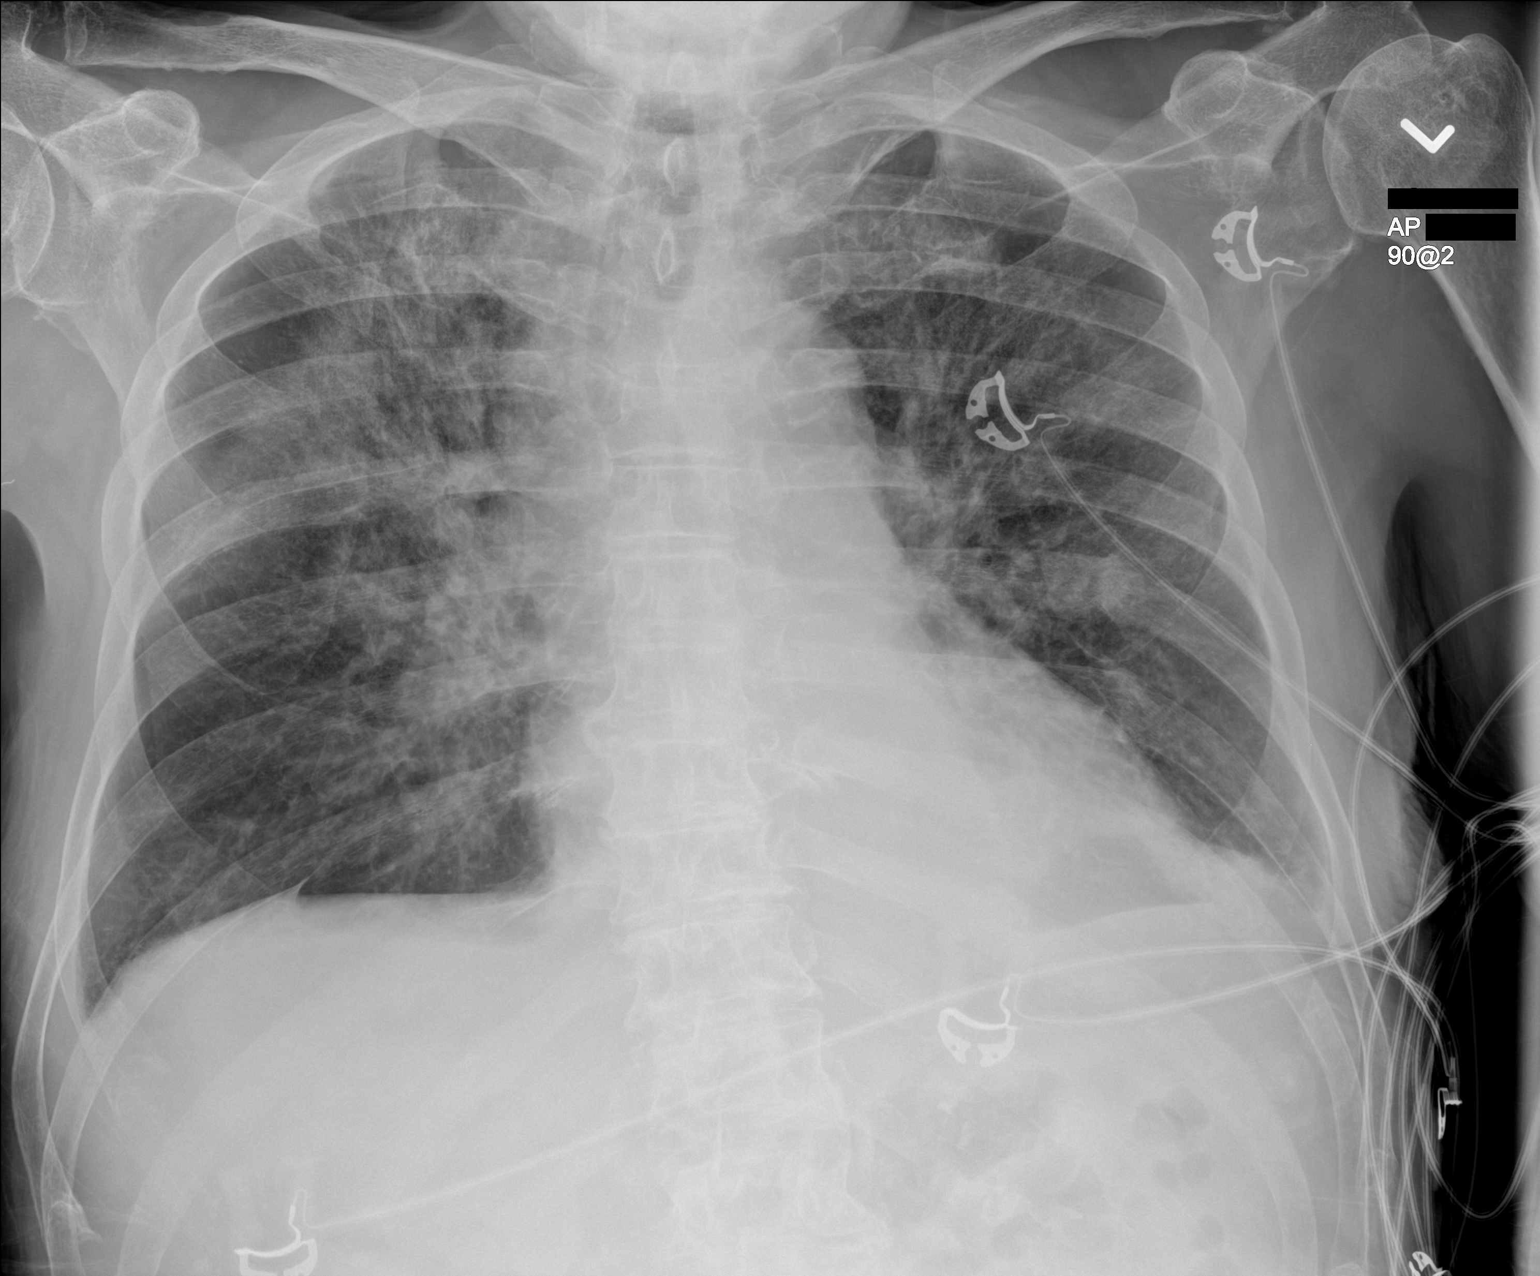

[2 of 2 positions shown; findings below may reference images not displayed]

FINDINGS: The heart size and mediastinal contours are within normal limits.
Mildly decreased bilateral lung opacities are noted suggesting
improving pneumonia. No pneumothorax is noted. Small pleural
effusions may be present. The visualized skeletal structures are
unremarkable.
IMPRESSION: Improved bilateral lung opacities compared to prior exam.
